# Patient Record
Sex: Female | Born: 1980
Health system: Southern US, Community
[De-identification: ages and names within clinical notes are randomized; demographics above are authoritative.]

## PROBLEM LIST (undated history)

## (undated) DIAGNOSIS — Z8659 Personal history of other mental and behavioral disorders: Secondary | ICD-10-CM

## (undated) DIAGNOSIS — O139 Gestational [pregnancy-induced] hypertension without significant proteinuria, unspecified trimester: Secondary | ICD-10-CM

## (undated) DIAGNOSIS — R12 Heartburn: Secondary | ICD-10-CM

## (undated) DIAGNOSIS — O2690 Pregnancy related conditions, unspecified, unspecified trimester: Secondary | ICD-10-CM

## (undated) DIAGNOSIS — Z8669 Personal history of other diseases of the nervous system and sense organs: Secondary | ICD-10-CM

## (undated) DIAGNOSIS — Z87442 Personal history of urinary calculi: Secondary | ICD-10-CM

## (undated) DIAGNOSIS — K589 Irritable bowel syndrome without diarrhea: Secondary | ICD-10-CM

## (undated) DIAGNOSIS — O26899 Other specified pregnancy related conditions, unspecified trimester: Secondary | ICD-10-CM

## (undated) HISTORY — DX: Gestational (pregnancy-induced) hypertension without significant proteinuria, unspecified trimester: O13.9

## (undated) HISTORY — DX: Pregnancy related conditions, unspecified, unspecified trimester: O26.90

## (undated) HISTORY — DX: Personal history of other diseases of the nervous system and sense organs: Z86.69

## (undated) HISTORY — DX: Personal history of other mental and behavioral disorders: Z86.59

## (undated) HISTORY — DX: Personal history of urinary calculi: Z87.442

## (undated) HISTORY — PX: WISDOM TOOTH EXTRACTION: SHX21

---

## 2000-11-27 ENCOUNTER — Other Ambulatory Visit: Admission: RE | Admit: 2000-11-27 | Discharge: 2000-11-27 | Payer: Self-pay | Admitting: Obstetrics and Gynecology

## 2004-02-08 ENCOUNTER — Emergency Department (HOSPITAL_COMMUNITY): Admission: EM | Admit: 2004-02-08 | Discharge: 2004-02-08 | Payer: Self-pay | Admitting: Emergency Medicine

## 2006-02-06 HISTORY — PX: ESOPHAGOGASTRODUODENOSCOPY: SHX1529

## 2006-02-06 HISTORY — PX: COLONOSCOPY: SHX174

## 2006-06-11 ENCOUNTER — Ambulatory Visit: Payer: Self-pay | Admitting: Gastroenterology

## 2006-06-15 ENCOUNTER — Encounter (INDEPENDENT_AMBULATORY_CARE_PROVIDER_SITE_OTHER): Payer: Self-pay | Admitting: Specialist

## 2006-06-15 ENCOUNTER — Ambulatory Visit (HOSPITAL_COMMUNITY): Admission: RE | Admit: 2006-06-15 | Discharge: 2006-06-15 | Payer: Self-pay | Admitting: Gastroenterology

## 2006-06-15 ENCOUNTER — Ambulatory Visit: Payer: Self-pay | Admitting: Gastroenterology

## 2006-07-11 ENCOUNTER — Ambulatory Visit: Payer: Self-pay | Admitting: Gastroenterology

## 2007-01-16 ENCOUNTER — Other Ambulatory Visit: Admission: RE | Admit: 2007-01-16 | Discharge: 2007-01-16 | Payer: Self-pay | Admitting: Obstetrics and Gynecology

## 2009-02-06 HISTORY — PX: DILATION AND CURETTAGE OF UTERUS: SHX78

## 2010-01-21 ENCOUNTER — Ambulatory Visit (HOSPITAL_COMMUNITY)
Admission: RE | Admit: 2010-01-21 | Discharge: 2010-01-21 | Payer: Self-pay | Source: Home / Self Care | Attending: Obstetrics and Gynecology | Admitting: Obstetrics and Gynecology

## 2010-02-06 DIAGNOSIS — Z87442 Personal history of urinary calculi: Secondary | ICD-10-CM

## 2010-02-06 HISTORY — DX: Personal history of urinary calculi: Z87.442

## 2010-04-18 LAB — CBC
MCH: 31.9 pg (ref 26.0–34.0)
MCH: 32.5 pg (ref 26.0–34.0)
MCHC: 34.8 g/dL (ref 30.0–36.0)
MCV: 93.8 fL (ref 78.0–100.0)
Platelets: 213 10*3/uL (ref 150–400)
Platelets: 229 10*3/uL (ref 150–400)
RDW: 12.9 % (ref 11.5–15.5)

## 2010-06-21 NOTE — Assessment & Plan Note (Signed)
NAMETHATIANA, RENBARGER              CHART#:  [id number]   DATE:  07/11/2006                       DOB:  [birth year]   REFERRING PHYSICIAN:  Dr. Carylon Perches.   PROBLEM LIST:  1. Rectal bleeding.  2. Loose stool for 11 years, likely secondary to irritable bowel.   SUBJECTIVE:  Ms. Ebony Cargo is a 30 year old female who is preparing to get  married. She presents as a return patient visit. She has not seen  anymore rectal bleeding. She adopted a high fiber diet and has noticed  improvement in her loose stool. She is not having loose stool with every  bowel movement. She has used fiber supplements in the past and has  stayed bloated. She received a sample of Digestive Advantage in the  mail and was wondering if she could use that. She is able to tolerate  broccoli and other fiber substances without bloating.   MEDICATIONS:  1. Estrostep daily.  2. Advil as needed.   OBJECTIVE:  VITAL SIGNS: Weight 181 pounds, height 5 foot 7 inches,  temperature 98.2, blood pressure 118/80, pulse 80.  GENERAL:  She is in no apparent distress, awake, alert, and oriented  x4.HEENT:  Atraumatic normocephalic, pupils equal, round, and reactive  to light, mouth shows no oral lesions, posterior pharynx without  erythema or exudate.  LUNGS:  Clear to auscultation bilaterally.CARDIOVASCULAR:  Regular  rhythm, no murmur, normal S1, S2.ABDOMEN:  Bowel sounds are present,  soft and nontender, nondistended, no rebound or guarding.   REVIEW OF SYSTEMS:  Last menstrual period: Currently she is  menstruating.   ASSESSMENT:  Ms. Ebony Cargo is a 30 year old female with chronic loose  stools, which is likely secondary to a functional gut disorder. Thank  you for allowing me to see Ms. Clayton in consultation. My  recommendations follow.   RECOMMENDATIONS:  1. Ms. Ebony Cargo should continue a high fiber diet. She was given a      handout on high fiber diet. We will not initiate a fiber supplement      because she has had  difficulty tolerating those in the past.  2. She may add Digestive Advantage which contains lactobacillus. She      is to try the lactobacillus for three weeks and if she notices a      significant improvement, she should continue those for an      additional six weeks.  3. If she shows no significant clinical improvement to her      satisfaction, then I have given her a prescription for Levsin      sublingual tablets 0.125 mg and she may use 1-2 sublingual every 6      hours as needed for her loose stool. I did discuss the side effects      of the medication which      include dry mouth, dry eyes, drowsiness, as well as urinary      retention.  4. She has a follow up appointment to see me in three months.       Kassie Mends, M.D.  Electronically Signed     SM/MEDQ  D:  07/11/2006  T:  07/12/2006  Job:  [id number]   cc:   Kingsley Callander. Ouida Sills, MD

## 2010-06-24 NOTE — Consult Note (Signed)
NAME:  Jacqueline, Vazquez             ACCOUNT NO.:  [id number]   MEDICAL RECORD NO.:  [id number]          PATIENT TYPE:  AMB   LOCATION:  DAY                           FACILITY:  [facility]   PHYSICIAN:  Kassie Mends, M.D.      DATE OF BIRTH:  [birth year]   DATE OF CONSULTATION:  06/15/2006  DATE OF DISCHARGE:                                 CONSULTATION   CHIEF COMPLAINT:  Bloody rectal bleeding   HISTORY OF PRESENT ILLNESS:  [name] is a 30 year old Caucasian  female who about nine days ago, she noted a significant amount of rectal  bleeding in the toilet. She also had a significant amount on the toilet  paper. She tells me she also had an episode of this about six months ago  for two days. This particular episode lasted five days. She tells me  that she has a self-reported history of IBS. Her stools are always  loose. On average, she has around four stools a day, but has at times  had up to ten to twelve loose watery stools per day. She says she has  occasional cramping, but not a lot in the way of abdominal pain. She  denies any proctalgia, pruritus, and denies any fever or chills. Denies  any recent foreign travel or antibiotics, new medications, or pets. She  is complaining of some bloating. She tells me as a child she was tested  for lactose intolerance and this was negative through [facility].   PAST MEDICAL HISTORY:  1. IBS  2. Renal lithiasis with tooth extraction   CURRENT MEDICATIONS:  1. Estrostep once daily  2. Advil 400 to 800 mg per day approximately once or twice per week   ALLERGIES:  PERCOCET AND FLAGYL.   FAMILY HISTORY:  There is no family history inflammatory bowel disease,  liver or chronic GI problems.   SOCIAL HISTORY:  Ms.  Jacqueline Vazquez is engaged to married June 21st. She is a  employed with [employer]. She has a less than 10 pack  year history of tobacco use. She drinks an alcoholic beverage every  three to four months. Denies any drug  use.   REVIEW OF SYSTEMS:  CONSTITUTIONAL: Her weight is stable. Denies any  fever or chills. Is complaining of some fatigue.  CARDIOVASCULAR:  Denies chest pain or palpitations.  PULMONARY:  Denies shortness of  breath, dyspnea, cough, or hemoptysis.  GI:  See HPI.  HEMATOLOGIC:  He  denies any history of anemia or blood dyscrasias. Denies any easy  bruising or epistaxis.  GYN:  Last menstrual period was approximately 28  days ago. She is on birth control pills. Denies any menorrhagia. She is  sexually active.   PHYSICAL EXAMINATION:  VITAL SIGNS:  Weight:  182 pounds. Height:  67  inches. Temp:  98.5. Blood pressure:  120/78. Pulse:  88.  GENERAL:  [name] is a well-developed, well-nourished Caucasian  female in no acute distress.  HEENT:  Sclerae clear, nonicteric. Conjunctivae pink.  Oropharynx pink  and moist without lesions.  NECK:  Supple with FROM,  no thyromegaly.  CHEST:  Heart regular, rate, and rhythm. Normal S1, S2 without murmurs,  rubs, or gallops.  LUNGS:  Clear to auscultation bilaterally.  ABDOMEN:  Positive bowel sounds times 4. No bruits auscultated, soft,  nontender, nondistended without palpable mass or hepatosplenomegaly.  No  rebound tenderness or guarding.  RECTAL:  Was deferred.  EXTREMITIES:  Without clubbing or edema bilaterally.  SKIN:  Pink, warm and dry without any rash or jaundice.   IMPRESSION:  [name] is a 30 year old Caucasian female with a  history of five days of rectal bleeding which started about nine days  ago. She also has chronic diarrhea and has been diagnosed with irritable  bowel syndrome.  Only has mild abdominal pain. We are going to need  further evaluation to determine the etiology of her rectal bleeding and  to rule out ulcerative colitis, Crohn's disease, diverticular bleeding,  benign intrarectal source such as internal hemorrhoids or fissure or  least likely colorectal carcinoma.   PLAN:  1. Obtain urine pregnancy,  CBC, and sed rate.  2. Colonoscopy with Dr. Cira Servant in the near future. I discussed with      procedure including the risks and benefits including but not      limited to an infection, perforation, drug reaction.   We would like to thank Dr. Ouida Sills for allowing [place] to participate in the  care of [name].      Nicholas Lose, N.P.      Kassie Mends, M.D.  Electronically Signed    KC/MEDQ  D:  06/12/2006  T:  06/12/2006  Job:  [id number]   cc:   Kingsley Callander. Ouida Sills, MD  Fax: [phone]

## 2010-06-24 NOTE — Op Note (Signed)
Jacqueline Vazquez, Jacqueline Vazquez             ACCOUNT NO.:  [id number]   MEDICAL RECORD NO.:  [id number]          PATIENT TYPE:  AMB   LOCATION:  DAY                           FACILITY:  [facility]   PHYSICIAN:  Kassie Mends, M.D.      DATE OF BIRTH:  07-23-1980   DATE OF PROCEDURE:  06/15/2006  DATE OF DISCHARGE:                               OPERATIVE REPORT   REFERRING PHYSICIAN:  Kingsley Callander. Ouida Sills, MD   PROCEDURES:  1. Colonoscopy with cold forceps biopsy.  2. Esophagogastroduodenoscopy with cold forceps biopsy.   INDICATION FOR EXAM:  [name] is a 30 year old female with rectal  bleeding.  She reports having a problem with diarrhea for the past 11  years.  She has been evaluated by [facility] in the past and told she  had irritable bowel.  She does not recall ever being evaluated for  celiac sprue.  The colonoscopy is being performed today to assess her  complaint of rectal bleeding.  The esophagogastroduodenoscopy is being  performed to biopsy her duodenum to evaluate for celiac sprue.   FINDINGS:  1. Normal colon without evidence of polyps, masses, inflammatory      changes, diverticula or AVMs.  No internal hemorrhoids.  Biopsies      obtained to evaluate for microscopic colitis.  2. Normal esophagus without evidence of Barrett's, erosions, or      ulcerations.  Normal stomach, duodenal bulb and second portion of      the duodenum.  Biopsies obtained to evaluate for celiac sprue.   RECOMMENDATIONS:  1. No source for rectal bleeding identified. Will call [name]      with the results of her biopsies.  2. She is given information on a high-fiber diet.  She is instructed      to begin the high-fiber diet after I call her with the results of      her biopsy report.  3. No aspirin, NSAIDs or anticoagulation for 5 days.  4. Follow-up appointment with Dr. Cira Servant in 3 weeks.  5. Colon cancer screeing at age 11.   MEDICATIONS:  1. Demerol 125 mg IV.  2. Versed 8 mg IV.   PROCEDURE  TECHNIQUE:  Physical exam was performed and informed consent  was obtained from the patient after explaining the benefits, risks and  alternatives to the procedure.  The patient was connected to the monitor  and placed in the left lateral position.  Continuous oxygen was provided  by nasal cannula and IV medicine administered through an indwelling  cannula.  After administration of sedation and rectal exam, the  patient's rectum was intubated and the scope was advanced under direct  visualization to the cecum.  The scope was subsequently removed slowly  by carefully examining the color, texture, anatomy and integrity of the  mucosa on the way out.   After the colonoscopy, the patient's esophagus was intubated and the  scope was advanced under direct visualization to the second portion of  the duodenum.  The scope was subsequently removed slowly by carefully  examine the color, texture, anatomy and integrity of the  mucosa on the  way out.  The patient was recovered in endoscopy and discharged home in  satisfactory condition.      Kassie Mends, M.D.  Electronically Signed     SM/MEDQ  D:  06/15/2006  T:  06/15/2006  Job:  [id number]   cc:   Kingsley Callander. Ouida Sills, MD  Fax: [phone]

## 2011-03-30 ENCOUNTER — Encounter (HOSPITAL_COMMUNITY): Payer: Self-pay | Admitting: *Deleted

## 2011-03-30 ENCOUNTER — Inpatient Hospital Stay (HOSPITAL_COMMUNITY)
Admission: AD | Admit: 2011-03-30 | Discharge: 2011-03-30 | Disposition: A | Discharge status: Home / Self Care | Payer: BC Managed Care – PPO | Source: Ambulatory Visit | Attending: Obstetrics and Gynecology | Admitting: Obstetrics and Gynecology

## 2011-03-30 DIAGNOSIS — O26859 Spotting complicating pregnancy, unspecified trimester: Secondary | ICD-10-CM

## 2011-03-30 LAB — CBC
Hemoglobin: 12.9 g/dL (ref 12.0–15.0)
MCH: 32.3 pg (ref 26.0–34.0)
MCHC: 34.4 g/dL (ref 30.0–36.0)
MCV: 94 fL (ref 78.0–100.0)

## 2011-03-30 LAB — WET PREP, GENITAL

## 2011-03-30 LAB — URINALYSIS, ROUTINE W REFLEX MICROSCOPIC
Bilirubin Urine: NEGATIVE
Glucose, UA: NEGATIVE mg/dL
Ketones, ur: 15 mg/dL — AB
Leukocytes, UA: NEGATIVE
Nitrite: NEGATIVE
Protein, ur: NEGATIVE mg/dL

## 2011-03-30 LAB — HCG, QUANTITATIVE, PREGNANCY: hCG, Beta Chain, Quant, S: 6575 m[IU]/mL — ABNORMAL HIGH (ref <5)

## 2011-03-30 NOTE — Progress Notes (Signed)
Pt states she had a pinkish red discharge -called the office RN and was told to come in and be evaulated

## 2011-03-30 NOTE — ED Provider Notes (Signed)
History     Chief Complaint  Patient presents with  . Vaginal Bleeding   HPI 31 y.o. G2P0010 at [redacted] weeks EGA with spotting since Sunday, today noticed "stringy" discharge along with spotting, no heavy bleeding or cramping. Is being followed in her doctor's office, has had normal progesterone level and normal rise in HCG. Has u/s scheduled on 3/13. Taking progesterone supplement d/t history of prior miscarriage.    Past Medical History  Diagnosis Date  . No pertinent past medical history     Past Surgical History  Procedure Date  . Dilation and evacuation   . Wisdom tooth extraction     Family History  Problem Relation Age of Onset  . Anesthesia problems Neg Hx     History  Substance Use Topics  . Smoking status: Former Games developer  . Smokeless tobacco: Not on file  . Alcohol Use: No    Allergies:  Allergies  Allergen Reactions  . Percocet (Oxycodone-Acetaminophen) Nausea And Vomiting  . Flagyl (Metronidazole Hcl) Rash    Prescriptions prior to admission  Medication Sig Dispense Refill  . ibuprofen (ADVIL,MOTRIN) 200 MG tablet Take 400 mg by mouth every 6 (six) hours as needed. Patient used this medication for pain.      . progesterone (ENDOMETRIN) 100 MG vaginal insert Place 100 mg vaginally 2 (two) times daily.      . Pseudoephedrine-Ibuprofen (ADVIL COLD/SINUS) 30-200 MG TABS Take 2 tablets by mouth daily as needed. Patient used this medication for cold symptoms.        Review of Systems  Constitutional: Negative.   Respiratory: Negative.   Cardiovascular: Negative.   Gastrointestinal: Negative for nausea, vomiting, abdominal pain, diarrhea and constipation.  Genitourinary: Negative for dysuria, urgency, frequency, hematuria and flank pain.       Positive for spotting  Musculoskeletal: Negative.   Neurological: Negative.   Psychiatric/Behavioral: Negative.    Physical Exam   Blood pressure 115/87, pulse 102, temperature 98.7 F (37.1 C), temperature source  Oral, resp. rate 20, height 5\' 7"  (1.702 m), weight 157 lb 6 oz (71.385 kg), SpO2 100.00%.  Physical Exam  Nursing note and vitals reviewed. Constitutional: She is oriented to person, place, and time. She appears well-developed and well-nourished. No distress.  HENT:  Head: Normocephalic and atraumatic.  Cardiovascular: Normal rate, regular rhythm and normal heart sounds.   Respiratory: Effort normal and breath sounds normal. No respiratory distress.  GI: Soft. Bowel sounds are normal. She exhibits no distension and no mass. There is no tenderness. There is no rebound and no guarding.  Genitourinary: There is no rash or lesion on the right labia. There is no rash or lesion on the left labia. Uterus is deviated. Cervix exhibits no discharge and no friability. There is bleeding (trace blood noted at cervix) around the vagina. No erythema or tenderness around the vagina. No vaginal discharge found.       Cervix closed   Neurological: She is alert and oriented to person, place, and time.  Skin: Skin is warm and dry.  Psychiatric: She has a normal mood and affect.    MAU Course  Procedures  Results for orders placed during the hospital encounter of 03/30/11 (from the past 24 hour(s))  URINALYSIS, ROUTINE W REFLEX MICROSCOPIC     Status: Abnormal   Collection Time   03/30/11  7:50 PM      Component Value Range   Color, Urine STRAW (*) YELLOW    APPearance CLEAR  CLEAR  Specific Gravity, Urine <1.005 (*) 1.005 - 1.030    pH 6.0  5.0 - 8.0    Glucose, UA NEGATIVE  NEGATIVE (mg/dL)   Hgb urine dipstick NEGATIVE  NEGATIVE    Bilirubin Urine NEGATIVE  NEGATIVE    Ketones, ur 15 (*) NEGATIVE (mg/dL)   Protein, ur NEGATIVE  NEGATIVE (mg/dL)   Urobilinogen, UA 0.2  0.0 - 1.0 (mg/dL)   Nitrite NEGATIVE  NEGATIVE    Leukocytes, UA NEGATIVE  NEGATIVE   POCT PREGNANCY, URINE     Status: Abnormal   Collection Time   03/30/11  8:40 PM      Component Value Range   Preg Test, Ur POSITIVE (*)  NEGATIVE   CBC     Status: Normal   Collection Time   03/30/11  8:40 PM      Component Value Range   WBC 10.1  4.0 - 10.5 (K/uL)   RBC 3.99  3.87 - 5.11 (MIL/uL)   Hemoglobin 12.9  12.0 - 15.0 (g/dL)   HCT 16.1  09.6 - 04.5 (%)   MCV 94.0  78.0 - 100.0 (fL)   MCH 32.3  26.0 - 34.0 (pg)   MCHC 34.4  30.0 - 36.0 (g/dL)   RDW 40.9  81.1 - 91.4 (%)   Platelets 187  150 - 400 (K/uL)  HCG, QUANTITATIVE, PREGNANCY     Status: Abnormal   Collection Time   03/30/11  8:40 PM      Component Value Range   hCG, Beta Chain, Quant, S 6575 (*) <5 (mIU/mL)  WET PREP, GENITAL     Status: Abnormal   Collection Time   03/30/11 10:05 PM      Component Value Range   Yeast Wet Prep HPF POC NONE SEEN  NONE SEEN    Trich, Wet Prep NONE SEEN  NONE SEEN    Clue Cells Wet Prep HPF POC NONE SEEN  NONE SEEN    WBC, Wet Prep HPF POC FEW (*) NONE SEEN    Pt prefers to wait for u/s as scheduled in office.   Assessment and Plan  31 y.o. G2P0010 at [redacted] weeks EGA Spotting - no evidence of SAB at this time, f/u as scheduled, precautions rev'd Jacqueline Vazquez 03/30/2011, 10:05 PM

## 2011-03-30 NOTE — Progress Notes (Signed)
Pt in c/o light spotting when wiping since Sunday.  Had labs drawn on Monday and on Wednesday BHCG had doubled.  States today had spotting as well but mixed with stringy mucus discharge.  Reports mild abdominal pain right behind belly button.  Reports lower back pain over weekend.  Is on progesterone suppository.  Has not had ultrasound yet.

## 2011-04-21 ENCOUNTER — Other Ambulatory Visit: Payer: Self-pay

## 2011-04-25 LAB — HIV ANTIBODY (ROUTINE TESTING W REFLEX)
HIV 1 Ab: NEGATIVE
HIV 2 Ab: NEGATIVE

## 2011-04-25 LAB — RUBELLA SCREEN: Rubella antibody, serum, titer: 44.1

## 2011-04-25 LAB — OB RESULTS CONSOLE RPR: RPR: NONREACTIVE

## 2011-04-25 LAB — HEPATITIS B SURFACE ANTIGEN: Hepatitis B Surface Antigen: NEGATIVE

## 2011-04-25 LAB — OB RESULTS CONSOLE HEPATITIS B SURFACE ANTIGEN: Hepatitis B Surface Ag: NEGATIVE

## 2011-04-25 LAB — RPR: RPR Ser-Titr: NONREACTIVE

## 2011-04-25 LAB — OB RESULTS CONSOLE HIV ANTIBODY (ROUTINE TESTING): HIV: NONREACTIVE

## 2011-04-25 LAB — OB RESULTS CONSOLE RUBELLA ANTIBODY, IGM: Rubella: IMMUNE

## 2011-05-30 ENCOUNTER — Other Ambulatory Visit (HOSPITAL_COMMUNITY): Payer: Self-pay | Admitting: Obstetrics and Gynecology

## 2011-05-30 DIAGNOSIS — IMO0002 Reserved for concepts with insufficient information to code with codable children: Secondary | ICD-10-CM

## 2011-05-30 DIAGNOSIS — Z0489 Encounter for examination and observation for other specified reasons: Secondary | ICD-10-CM

## 2011-06-27 ENCOUNTER — Ambulatory Visit (HOSPITAL_COMMUNITY)
Admission: RE | Admit: 2011-06-27 | Discharge: 2011-06-27 | Disposition: A | Discharge status: Home / Self Care | Payer: BC Managed Care – PPO | Source: Ambulatory Visit | Attending: Obstetrics and Gynecology | Admitting: Obstetrics and Gynecology

## 2011-06-27 ENCOUNTER — Encounter (HOSPITAL_COMMUNITY): Payer: Self-pay

## 2011-06-27 DIAGNOSIS — Z1389 Encounter for screening for other disorder: Secondary | ICD-10-CM | POA: Insufficient documentation

## 2011-06-27 DIAGNOSIS — IMO0002 Reserved for concepts with insufficient information to code with codable children: Secondary | ICD-10-CM

## 2011-06-27 DIAGNOSIS — Z363 Encounter for antenatal screening for malformations: Secondary | ICD-10-CM | POA: Insufficient documentation

## 2011-06-27 DIAGNOSIS — O358XX0 Maternal care for other (suspected) fetal abnormality and damage, not applicable or unspecified: Secondary | ICD-10-CM | POA: Insufficient documentation

## 2011-06-27 DIAGNOSIS — Z0489 Encounter for examination and observation for other specified reasons: Secondary | ICD-10-CM

## 2011-06-27 NOTE — ED Notes (Signed)
Patient not yet feeling fetal movement. Right hip pain at night with occasional cramping that has been going on since beginning of pregnancy. No recent spotting, bleeding approximately 3 weeks ago and found to have [facility] by [place]. Patient does have history of kidney stones.

## 2011-08-04 ENCOUNTER — Inpatient Hospital Stay (HOSPITAL_COMMUNITY): Payer: BC Managed Care – PPO

## 2011-08-04 ENCOUNTER — Encounter (HOSPITAL_COMMUNITY): Payer: Self-pay | Admitting: *Deleted

## 2011-08-04 ENCOUNTER — Inpatient Hospital Stay (HOSPITAL_COMMUNITY)
Admission: AD | Admit: 2011-08-04 | Discharge: 2011-08-04 | Disposition: A | Discharge status: Home / Self Care | Payer: BC Managed Care – PPO | Source: Ambulatory Visit | Attending: Obstetrics and Gynecology | Admitting: Obstetrics and Gynecology

## 2011-08-04 DIAGNOSIS — O99019 Anemia complicating pregnancy, unspecified trimester: Secondary | ICD-10-CM | POA: Insufficient documentation

## 2011-08-04 DIAGNOSIS — W19XXXA Unspecified fall, initial encounter: Secondary | ICD-10-CM

## 2011-08-04 DIAGNOSIS — O99891 Other specified diseases and conditions complicating pregnancy: Secondary | ICD-10-CM | POA: Insufficient documentation

## 2011-08-04 DIAGNOSIS — Z711 Person with feared health complaint in whom no diagnosis is made: Secondary | ICD-10-CM

## 2011-08-04 DIAGNOSIS — R296 Repeated falls: Secondary | ICD-10-CM | POA: Insufficient documentation

## 2011-08-04 DIAGNOSIS — D649 Anemia, unspecified: Secondary | ICD-10-CM | POA: Insufficient documentation

## 2011-08-04 HISTORY — DX: Irritable bowel syndrome, unspecified: K58.9

## 2011-08-04 LAB — CBC
HCT: 32.4 % — ABNORMAL LOW (ref 36.0–46.0)
Hemoglobin: 10.8 g/dL — ABNORMAL LOW (ref 12.0–15.0)
MCHC: 33.3 g/dL (ref 30.0–36.0)
RBC: 3.39 MIL/uL — ABNORMAL LOW (ref 3.87–5.11)

## 2011-08-04 LAB — KLEIHAUER-BETKE STAIN
Fetal Cells %: 0 %
Quantitation Fetal Hemoglobin: 0 mL

## 2011-08-04 NOTE — MAU Note (Addendum)
Pt stated she fell on her left side yesterday. Pt called MD yesterday but they did not call back until today and then told her to come in.Pt reports mild pain or discomfort  To right groin area that comes and goes at this time and reports fetal movment normal.

## 2011-08-04 NOTE — MAU Note (Signed)
Dr. Stinson at bedside.  

## 2011-08-04 NOTE — Discharge Instructions (Signed)
Injuries in Pregnancy Injuries can happen during pregnancy. Minor falls and accidents usually do not harm the mother or unborn baby. However, any injury should be reported to your doctor. HOME CARE  Do not take aspirin. It can make any bleeding you may have worse.   Put ice on the injured area.   Put ice in a plastic bag.   Place a towel between your skin and the bag.   Leave the ice on for 15 to 20 minutes, 3 to 4 times a day.   Put warm packs on the injured area after 24 hours if told by your doctor.   Have someone care for you and help you if needed.   Do not wear high heels while pregnant.   Remove rugs and loose objects on the floor.   Avoid fire or starting fires.   Avoid lifting heavy pots of boiling liquid.  GET HELP RIGHT AWAY IF:  You have been a victim of domestic violence.   You have been in a car accident.   You have more pain in any part of the body.   You have bleeding from your vagina.   Fluid is leaking from your vagina.   You start to have belly cramping (contractions) or pain.   You have a stiff neck or neck pain.   You feel weak or pass out (faint).   You start to throw up (vomit) after an injury.   You have been burned.   You get a headache or have vision problems after an injury.   You do not feel the baby move or the baby is not moving as much as normal.  MAKE SURE YOU:  Understand these instructions.   Will watch your condition.   Will get help right away if you are not doing well or get worse.  Document Released: 02/25/2010 Document Revised: 01/12/2011 Document Reviewed: 02/25/2010 [facility] Patient Information 2012 [place], [place].

## 2011-08-04 NOTE — MAU Provider Note (Signed)
History     CSN: [id number]  Arrival date and time: 08/04/11 1413   First Provider Initiated Contact with Patient 08/04/11 1535      Chief Complaint  Patient presents with  . Fall   HPI 31yo G2P0010 at 23.1 weeks who was sent here by on call physician due to fall that occurred yesterday at work.  Impact was on left hip - no impact to the abdomen.  She attempted to call her physician yesterday, but received no return call.  She reports no contractions or abdominal cramping, she denies vaginal bleeding.  OB History    Grav Para Term Preterm Abortions TAB SAB Ect Mult Living   2 0 0 0 1 0 1 0 0 0       Past Medical History  Diagnosis Date  . No pertinent past medical history   . Headache   . Kidney stones   . Irritable bowel syndrome   . Kidney stones     Past Surgical History  Procedure Date  . Dilation and evacuation   . Wisdom tooth extraction   . Dilation and curettage of uterus   . Colonoscpopy   . Upper gastrointestinal endoscopy     Family History  Problem Relation Age of Onset  . Anesthesia problems Neg Hx   . Other Mother     History  Substance Use Topics  . Smoking status: Former Games developer  . Smokeless tobacco: Not on file  . Alcohol Use: No    Allergies:  Allergies  Allergen Reactions  . Percocet (Oxycodone-Acetaminophen) Nausea And Vomiting  . Flagyl (Metronidazole Hcl) Rash    Prescriptions prior to admission  Medication Sig Dispense Refill  . acetaminophen (TYLENOL) 325 MG tablet Take 650 mg by mouth every 6 (six) hours as needed. For headache.      . Prenatal Vit-Fe Fumarate-FA (PRENATAL MULTIVITAMIN) TABS Take 1 tablet by mouth daily.        Review of Systems  All other systems reviewed and are negative.   Physical Exam   Blood pressure 128/74, pulse 100, temperature 98.6 F (37 C), temperature source Oral, resp. rate 18, height 5\' 7"  (1.702 m), weight 86.456 kg (190 lb 9.6 oz), last menstrual period 02/23/2011.  Physical Exam    Constitutional: She is oriented to person, place, and time. She appears well-developed and well-nourished.  HENT:  Head: Normocephalic and atraumatic.  GI: Soft. She exhibits no distension and no mass. There is no tenderness. There is no rebound and no guarding.  Neurological: She is alert and oriented to person, place, and time.  Skin: Skin is warm and dry.  Psychiatric: She has a normal mood and affect. Her behavior is normal. Judgment and thought content normal.   Results for orders placed during the hospital encounter of 08/04/11 (from the past 24 hour(s))  CBC     Status: Abnormal   Collection Time   08/04/11  4:25 PM      Component Value Range   WBC 10.1  4.0 - 10.5 K/uL   RBC 3.39 (*) 3.87 - 5.11 MIL/uL   Hemoglobin 10.8 (*) 12.0 - 15.0 g/dL   HCT 14.7 (*) 82.9 - 56.2 %   MCV 95.6  78.0 - 100.0 fL   MCH 31.9  26.0 - 34.0 pg   MCHC 33.3  30.0 - 36.0 g/dL   RDW 13.0  86.5 - 78.4 %   Platelets 182  150 - 400 K/uL  KLEIHAUER-BETKE STAIN     Status: Normal  Collection Time   08/04/11  4:25 PM      Component Value Range   Fetal Cells % 0.0     Quantitation Fetal Hemoglobin 0        MAU Course  Procedures Fetal monitoring - appropriate for gestational age.  No contractions, decelerations.  MDM KB neg.  CBC shows mild anemia.  [place] without evidence of placental abruption.  Assessment and Plan  1.  S/P fall  No evidence of fetal harm or injury.  Will send patient home.  Dr Arelia Sneddon informed of patient care.  Idamae Coccia JEHIEL 08/04/2011, 3:40 PM

## 2011-08-04 NOTE — MAU Note (Signed)
Dr Adrian Blackwater with pt discussing discharge plan of care. Pt agrees to plan.

## 2011-11-07 ENCOUNTER — Encounter (HOSPITAL_COMMUNITY): Payer: Self-pay | Admitting: Pharmacist

## 2011-11-13 ENCOUNTER — Encounter (HOSPITAL_COMMUNITY): Payer: Self-pay

## 2011-11-15 ENCOUNTER — Encounter (HOSPITAL_COMMUNITY): Payer: Self-pay

## 2011-11-15 ENCOUNTER — Encounter (HOSPITAL_COMMUNITY)
Admission: RE | Admit: 2011-11-15 | Discharge: 2011-11-15 | Disposition: A | Discharge status: Home / Self Care | Payer: BC Managed Care – PPO | Source: Ambulatory Visit | Attending: Obstetrics and Gynecology | Admitting: Obstetrics and Gynecology

## 2011-11-15 HISTORY — DX: Other specified pregnancy related conditions, unspecified trimester: O26.899

## 2011-11-15 HISTORY — DX: Other specified pregnancy related conditions, unspecified trimester: R12

## 2011-11-15 HISTORY — DX: Heartburn: R12

## 2011-11-15 LAB — CBC
HCT: 37 % (ref 36.0–46.0)
Hemoglobin: 12.6 g/dL (ref 12.0–15.0)
MCH: 32.8 pg (ref 26.0–34.0)
MCHC: 34.1 g/dL (ref 30.0–36.0)
MCV: 96.4 fL (ref 78.0–100.0)
Platelets: 158 K/uL (ref 150–400)
RBC: 3.84 MIL/uL — ABNORMAL LOW (ref 3.87–5.11)
RDW: 13.4 % (ref 11.5–15.5)
WBC: 10.8 K/uL — ABNORMAL HIGH (ref 4.0–10.5)

## 2011-11-15 LAB — TYPE AND SCREEN
ABO/RH(D): A POS
Antibody Screen: NEGATIVE

## 2011-11-15 LAB — SYPHILIS: RPR W/REFLEX TO RPR TITER AND TREPONEMAL ANTIBODIES, TRADITIONAL SCREENING AND DIAGNOSIS ALGORITHM: RPR Ser Ql: NONREACTIVE

## 2011-11-15 LAB — SURGICAL PCR SCREEN
MRSA, PCR: NEGATIVE
Staphylococcus aureus: NEGATIVE

## 2011-11-15 NOTE — Patient Instructions (Addendum)
   Your procedure is scheduled on: Thursday October 17th  Enter through the Main Entrance of [facility] at: 11:30am Pick up the phone at the desk and dial [phone] and inform us of your arrival.  Please call this number if you have any problems the morning of surgery: [phone]  Remember: Do not eat food after midnight on Wednesday You may have clear liquids until 9am on Thursday Take these medicines the morning of surgery with a SIP OF WATER:none  Do not wear jewelry, make-up, or FINGER nail polish No metal in your hair or on your body. Do not wear lotions, powders, perfumes. You may wear deodorant.  Please use your CHG wash as directed prior to surgery.  Do not shave anywhere for at least 12 hours prior to first CHG shower.  Do not bring valuables to the hospital. Contacts, dentures or bridgework may not be worn into surgery.  Leave suitcase in the car. After Surgery it may be brought to your room. For patients being admitted to the hospital, checkout time is 11:00am the day of discharge.

## 2011-11-23 ENCOUNTER — Encounter (HOSPITAL_COMMUNITY): Payer: Self-pay

## 2011-11-23 ENCOUNTER — Inpatient Hospital Stay (HOSPITAL_COMMUNITY): Payer: BC Managed Care – PPO

## 2011-11-23 ENCOUNTER — Inpatient Hospital Stay (HOSPITAL_COMMUNITY)
Admission: AD | Admit: 2011-11-23 | Discharge: 2011-11-26 | DRG: 371 | Disposition: A | Discharge status: Home / Self Care | Payer: BC Managed Care – PPO | Source: Ambulatory Visit | Attending: Obstetrics and Gynecology | Admitting: Obstetrics and Gynecology

## 2011-11-23 ENCOUNTER — Encounter (HOSPITAL_COMMUNITY): Payer: Self-pay | Admitting: *Deleted

## 2011-11-23 ENCOUNTER — Encounter (HOSPITAL_COMMUNITY)
Admission: AD | Disposition: A | Discharge status: Home / Self Care | Payer: Self-pay | Source: Ambulatory Visit | Attending: Obstetrics and Gynecology

## 2011-11-23 DIAGNOSIS — O43219 Placenta accreta, unspecified trimester: Secondary | ICD-10-CM

## 2011-11-23 DIAGNOSIS — O321XX0 Maternal care for breech presentation, not applicable or unspecified: Principal | ICD-10-CM | POA: Diagnosis present

## 2011-11-23 LAB — CBC
Hemoglobin: 12.5 g/dL (ref 12.0–15.0)
MCH: 31.7 pg (ref 26.0–34.0)
MCH: 32.3 pg (ref 26.0–34.0)
MCHC: 33.7 g/dL (ref 30.0–36.0)
MCHC: 34 g/dL (ref 30.0–36.0)
MCV: 95.9 fL (ref 78.0–100.0)
Platelets: 133 10*3/uL — ABNORMAL LOW (ref 150–400)
Platelets: 121 10*3/uL — ABNORMAL LOW (ref 150–400)
Platelets: 125 10*3/uL — ABNORMAL LOW (ref 150–400)
RBC: 3.94 MIL/uL (ref 3.87–5.11)
RDW: 13.1 % (ref 11.5–15.5)
RDW: 17.3 % — ABNORMAL HIGH (ref 11.5–15.5)
WBC: 9.1 10*3/uL (ref 4.0–10.5)

## 2011-11-23 LAB — DIC (DISSEMINATED INTRAVASCULAR COAGULATION)PANEL
D-Dimer, Quant: >20 ug/mL-FEU — ABNORMAL HIGH (ref 0.00–0.48)
INR: 1.22 (ref 0.00–1.49)
Platelets: 117 10*3/uL — ABNORMAL LOW (ref 150–400)
Smear Review: NONE SEEN
aPTT: 42 seconds — ABNORMAL HIGH (ref 24–37)

## 2011-11-23 LAB — MRSA PCR SCREENING: MRSA by PCR: NEGATIVE

## 2011-11-23 SURGERY — Surgical Case
Anesthesia: Spinal | Site: Abdomen | Wound class: Clean Contaminated

## 2011-11-23 MED ORDER — FENTANYL CITRATE 0.05 MG/ML IJ SOLN
INTRAMUSCULAR | Status: AC
  Filled 2011-11-23: qty 2

## 2011-11-23 MED ORDER — TETANUS-DIPHTH-ACELL PERTUSSIS 5-2.5-18.5 LF-MCG/0.5 IM SUSP
0.5000 mL | Freq: Once | INTRAMUSCULAR | Status: DC
  Filled 2011-11-23: qty 0.5

## 2011-11-23 MED ORDER — DIBUCAINE 1 % RE OINT: 1.0000 "application " | TOPICAL_OINTMENT | RECTAL | Status: DC | PRN

## 2011-11-23 MED ORDER — NALBUPHINE HCL 10 MG/ML IJ SOLN: 5.0000 mg | INTRAMUSCULAR | Status: DC | PRN

## 2011-11-23 MED ORDER — ONDANSETRON HCL 4 MG/2ML IJ SOLN: 4.0000 mg | Freq: Three times a day (TID) | INTRAMUSCULAR | Status: DC | PRN

## 2011-11-23 MED ORDER — ONDANSETRON HCL 4 MG/2ML IJ SOLN
INTRAMUSCULAR | Status: AC
  Filled 2011-11-23: qty 2

## 2011-11-23 MED ORDER — MORPHINE SULFATE 0.5 MG/ML IJ SOLN
INTRAMUSCULAR | Status: AC
  Filled 2011-11-23: qty 10

## 2011-11-23 MED ORDER — MENTHOL 3 MG MT LOZG: 1.0000 | LOZENGE | OROMUCOSAL | Status: DC | PRN

## 2011-11-23 MED ORDER — SIMETHICONE 80 MG PO CHEW
80.0000 mg | CHEWABLE_TABLET | Freq: Three times a day (TID) | ORAL | Status: DC
  Administered 2011-11-23 – 2011-11-26 (×11): 80 mg via ORAL

## 2011-11-23 MED ORDER — PHENYLEPHRINE HCL 10 MG/ML IJ SOLN
10000.0000 ug | INTRAVENOUS | Status: DC | PRN
  Administered 2011-11-23: 40 ug/min via INTRAVENOUS

## 2011-11-23 MED ORDER — OXYTOCIN 10 UNIT/ML IJ SOLN
INTRAMUSCULAR | Status: DC | PRN
  Administered 2011-11-23: 10 [IU] via INTRAMUSCULAR

## 2011-11-23 MED ORDER — EPHEDRINE 5 MG/ML INJ
INTRAVENOUS | Status: AC
  Filled 2011-11-23: qty 10

## 2011-11-23 MED ORDER — SCOPOLAMINE 1 MG/3DAYS TD PT72
MEDICATED_PATCH | TRANSDERMAL | Status: AC
  Administered 2011-11-23: 1.5 mg via TRANSDERMAL
  Filled 2011-11-23: qty 1

## 2011-11-23 MED ORDER — LANOLIN HYDROUS EX OINT: 1.0000 "application " | TOPICAL_OINTMENT | CUTANEOUS | Status: DC | PRN

## 2011-11-23 MED ORDER — HYDROCODONE-ACETAMINOPHEN 5-325 MG PO TABS
1.0000 | ORAL_TABLET | ORAL | Status: DC | PRN
  Administered 2011-11-23 – 2011-11-24 (×5): 1 via ORAL
  Administered 2011-11-25: 2 via ORAL
  Administered 2011-11-25 – 2011-11-26 (×3): 1 via ORAL
  Filled 2011-11-23: qty 2
  Filled 2011-11-23 (×8): qty 1

## 2011-11-23 MED ORDER — ONDANSETRON HCL 4 MG PO TABS: 4.0000 mg | ORAL_TABLET | ORAL | Status: DC | PRN

## 2011-11-23 MED ORDER — DIPHENHYDRAMINE HCL 25 MG PO CAPS: 25.0000 mg | ORAL_CAPSULE | Freq: Four times a day (QID) | ORAL | Status: DC | PRN

## 2011-11-23 MED ORDER — FAMOTIDINE IN NACL 20-0.9 MG/50ML-% IV SOLN
20.0000 mg | Freq: Two times a day (BID) | INTRAVENOUS | Status: DC
  Administered 2011-11-23 – 2011-11-24 (×3): 20 mg via INTRAVENOUS
  Filled 2011-11-23 (×4): qty 50

## 2011-11-23 MED ORDER — FENTANYL CITRATE 0.05 MG/ML IJ SOLN
25.0000 ug | INTRAMUSCULAR | Status: DC | PRN
  Administered 2011-11-23 (×3): 50 ug via INTRAVENOUS

## 2011-11-23 MED ORDER — OXYTOCIN 10 UNIT/ML IJ SOLN
INTRAMUSCULAR | Status: AC
  Filled 2011-11-23: qty 4

## 2011-11-23 MED ORDER — FENTANYL CITRATE 0.05 MG/ML IJ SOLN
INTRAMUSCULAR | Status: DC | PRN
  Administered 2011-11-23: 25 ug via INTRATHECAL

## 2011-11-23 MED ORDER — PHENYLEPHRINE 40 MCG/ML (10ML) SYRINGE FOR IV PUSH (FOR BLOOD PRESSURE SUPPORT)
PREFILLED_SYRINGE | INTRAVENOUS | Status: AC
  Filled 2011-11-23: qty 45

## 2011-11-23 MED ORDER — METHYLERGONOVINE MALEATE 0.2 MG/ML IJ SOLN
INTRAMUSCULAR | Status: DC | PRN
  Administered 2011-11-23: 0.2 mg via INTRAMUSCULAR

## 2011-11-23 MED ORDER — MEPERIDINE HCL 25 MG/ML IJ SOLN: 6.2500 mg | INTRAMUSCULAR | Status: DC | PRN

## 2011-11-23 MED ORDER — PHENYLEPHRINE HCL 10 MG/ML IJ SOLN
INTRAMUSCULAR | Status: DC | PRN
  Administered 2011-11-23 (×2): 80 ug via INTRAVENOUS
  Administered 2011-11-23: 40 ug via INTRAVENOUS
  Administered 2011-11-23 (×12): 80 ug via INTRAVENOUS

## 2011-11-23 MED ORDER — CARBOPROST TROMETHAMINE 250 MCG/ML IM SOLN
INTRAMUSCULAR | Status: DC | PRN
  Administered 2011-11-23: 250 ug via INTRAMUSCULAR

## 2011-11-23 MED ORDER — DIPHENHYDRAMINE HCL 50 MG/ML IJ SOLN: 12.5000 mg | INTRAMUSCULAR | Status: DC | PRN

## 2011-11-23 MED ORDER — LACTATED RINGERS IV SOLN
INTRAVENOUS | Status: DC
  Administered 2011-11-23 (×6): via INTRAVENOUS

## 2011-11-23 MED ORDER — MIDAZOLAM HCL 2 MG/2ML IJ SOLN
INTRAMUSCULAR | Status: AC
  Filled 2011-11-23: qty 2

## 2011-11-23 MED ORDER — CEFAZOLIN SODIUM-DEXTROSE 2-3 GM-% IV SOLR
2.0000 g | INTRAVENOUS | Status: AC
  Administered 2011-11-23: 2 g via INTRAVENOUS

## 2011-11-23 MED ORDER — METOCLOPRAMIDE HCL 5 MG/ML IJ SOLN: 10.0000 mg | Freq: Three times a day (TID) | INTRAMUSCULAR | Status: DC | PRN

## 2011-11-23 MED ORDER — MORPHINE SULFATE (PF) 0.5 MG/ML IJ SOLN
INTRAMUSCULAR | Status: DC | PRN
  Administered 2011-11-23: .15 mg via INTRATHECAL

## 2011-11-23 MED ORDER — WITCH HAZEL-GLYCERIN EX PADS: 1.0000 "application " | MEDICATED_PAD | CUTANEOUS | Status: DC | PRN

## 2011-11-23 MED ORDER — DIPHENHYDRAMINE HCL 50 MG/ML IJ SOLN: 25.0000 mg | INTRAMUSCULAR | Status: DC | PRN

## 2011-11-23 MED ORDER — MEASLES, MUMPS & RUBELLA VAC ~~LOC~~ INJ: 0.5000 mL | INJECTION | Freq: Once | SUBCUTANEOUS | Status: DC

## 2011-11-23 MED ORDER — KETOROLAC TROMETHAMINE 30 MG/ML IJ SOLN: 30.0000 mg | Freq: Four times a day (QID) | INTRAMUSCULAR | Status: DC | PRN

## 2011-11-23 MED ORDER — DIPHENHYDRAMINE HCL 25 MG PO CAPS: 25.0000 mg | ORAL_CAPSULE | ORAL | Status: DC | PRN

## 2011-11-23 MED ORDER — FENTANYL CITRATE 0.05 MG/ML IJ SOLN
INTRAMUSCULAR | Status: AC
  Administered 2011-11-23: 50 ug via INTRAVENOUS
  Filled 2011-11-23: qty 2

## 2011-11-23 MED ORDER — ONDANSETRON HCL 4 MG/2ML IJ SOLN
INTRAMUSCULAR | Status: DC | PRN
  Administered 2011-11-23: 4 mg via INTRAVENOUS

## 2011-11-23 MED ORDER — SIMETHICONE 80 MG PO CHEW: 80.0000 mg | CHEWABLE_TABLET | ORAL | Status: DC | PRN

## 2011-11-23 MED ORDER — PHENYLEPHRINE HCL 10 MG/ML IJ SOLN
INTRAMUSCULAR | Status: AC
  Filled 2011-11-23: qty 1

## 2011-11-23 MED ORDER — MEDROXYPROGESTERONE ACETATE 150 MG/ML IM SUSP: 150.0000 mg | INTRAMUSCULAR | Status: DC | PRN

## 2011-11-23 MED ORDER — IBUPROFEN 600 MG PO TABS: 600.0000 mg | ORAL_TABLET | Freq: Four times a day (QID) | ORAL | Status: DC | PRN

## 2011-11-23 MED ORDER — SODIUM CHLORIDE 0.9 % IJ SOLN
3.0000 mL | INTRAMUSCULAR | Status: DC | PRN
  Administered 2011-11-24 (×2): 3 mL via INTRAVENOUS

## 2011-11-23 MED ORDER — LIDOCAINE HCL (PF) 1 % IJ SOLN
INTRAMUSCULAR | Status: AC
  Filled 2011-11-23: qty 5

## 2011-11-23 MED ORDER — BUPIVACAINE IN DEXTROSE 0.75-8.25 % IT SOLN
INTRATHECAL | Status: DC | PRN
  Administered 2011-11-23: 1.7 mL via INTRATHECAL

## 2011-11-23 MED ORDER — CEFAZOLIN SODIUM-DEXTROSE 1-4 GM-% IV SOLR
1.0000 g | Freq: Three times a day (TID) | INTRAVENOUS | Status: DC
  Administered 2011-11-23 – 2011-11-25 (×5): 1 g via INTRAVENOUS
  Filled 2011-11-23 (×6): qty 50

## 2011-11-23 MED ORDER — SODIUM CHLORIDE 0.9 % IV BOLUS (SEPSIS)
500.0000 mL | Freq: Once | INTRAVENOUS | Status: AC
  Administered 2011-11-23: 500 mL via INTRAVENOUS

## 2011-11-23 MED ORDER — SENNOSIDES-DOCUSATE SODIUM 8.6-50 MG PO TABS
2.0000 | ORAL_TABLET | Freq: Every day | ORAL | Status: DC
  Administered 2011-11-23 – 2011-11-25 (×3): 2 via ORAL

## 2011-11-23 MED ORDER — CEFAZOLIN SODIUM-DEXTROSE 2-3 GM-% IV SOLR
INTRAVENOUS | Status: AC
  Filled 2011-11-23: qty 50

## 2011-11-23 MED ORDER — OXYTOCIN 10 UNIT/ML IJ SOLN
40.0000 [IU] | INTRAMUSCULAR | Status: DC | PRN
  Administered 2011-11-23: 40 [IU] via INTRAVENOUS

## 2011-11-23 MED ORDER — NALOXONE HCL 0.4 MG/ML IJ SOLN: 0.4000 mg | INTRAMUSCULAR | Status: DC | PRN

## 2011-11-23 MED ORDER — EPHEDRINE SULFATE 50 MG/ML IJ SOLN
INTRAMUSCULAR | Status: DC | PRN
  Administered 2011-11-23 (×2): 10 mg via INTRAVENOUS

## 2011-11-23 MED ORDER — ONDANSETRON HCL 4 MG/2ML IJ SOLN: 4.0000 mg | INTRAMUSCULAR | Status: DC | PRN

## 2011-11-23 MED ORDER — SODIUM CHLORIDE 0.9 % IV SOLN
INTRAVENOUS | Status: DC | PRN
  Administered 2011-11-23: 14:00:00 via INTRAVENOUS

## 2011-11-23 MED ORDER — IBUPROFEN 600 MG PO TABS
600.0000 mg | ORAL_TABLET | Freq: Four times a day (QID) | ORAL | Status: DC
  Administered 2011-11-23 – 2011-11-26 (×11): 600 mg via ORAL
  Filled 2011-11-23 (×11): qty 1

## 2011-11-23 MED ORDER — OXYTOCIN 40 UNITS IN LACTATED RINGERS INFUSION - SIMPLE MED
62.5000 mL/h | INTRAVENOUS | Status: AC
  Administered 2011-11-23: 62.5 mL/h via INTRAVENOUS
  Filled 2011-11-23: qty 1000

## 2011-11-23 MED ORDER — PRENATAL MULTIVITAMIN CH
1.0000 | ORAL_TABLET | Freq: Every day | ORAL | Status: DC
  Administered 2011-11-24 – 2011-11-26 (×2): 1 via ORAL
  Filled 2011-11-23 (×2): qty 1

## 2011-11-23 MED ORDER — SCOPOLAMINE 1 MG/3DAYS TD PT72
1.0000 | MEDICATED_PATCH | Freq: Once | TRANSDERMAL | Status: DC
  Administered 2011-11-23: 1.5 mg via TRANSDERMAL

## 2011-11-23 MED ORDER — SODIUM CHLORIDE 0.9 % IV SOLN: 1.0000 ug/kg/h | INTRAVENOUS | Status: DC | PRN

## 2011-11-23 MED ORDER — DEXTROSE IN LACTATED RINGERS 5 % IV SOLN
INTRAVENOUS | Status: DC
  Administered 2011-11-23: 500 mL via INTRAVENOUS
  Administered 2011-11-23 – 2011-11-24 (×2): via INTRAVENOUS

## 2011-11-23 SURGICAL SUPPLY — 41 items
ADH SKN CLS APL DERMABOND .7 (GAUZE/BANDAGES/DRESSINGS) ×1
BAG URINE DRAINAGE (UROLOGICAL SUPPLIES) ×1 IMPLANT
BALLN POSTPARTUM SOS BAKRI (BALLOONS) ×2
BALLOON POSTPARTUM SOS BAKRI (BALLOONS) IMPLANT
BANDAGE GAUZE ELAST BULKY 4 IN (GAUZE/BANDAGES/DRESSINGS) ×1 IMPLANT
CLOTH BEACON ORANGE TIMEOUT ST (SAFETY) ×2 IMPLANT
DERMABOND ADVANCED (GAUZE/BANDAGES/DRESSINGS) ×1
DERMABOND ADVANCED .7 DNX12 (GAUZE/BANDAGES/DRESSINGS) ×1 IMPLANT
DRAPE SURG 17X23 STRL (DRAPES) ×2 IMPLANT
DRSG COVADERM 4X10 (GAUZE/BANDAGES/DRESSINGS) ×2 IMPLANT
DURAPREP 26ML APPLICATOR (WOUND CARE) ×2 IMPLANT
ELECT REM PT RETURN 9FT ADLT (ELECTROSURGICAL) ×2
ELECTRODE REM PT RTRN 9FT ADLT (ELECTROSURGICAL) ×1 IMPLANT
EXTRACTOR VACUUM M CUP 4 TUBE (SUCTIONS) IMPLANT
GAUZE SPONGE 4X4 16PLY XRAY LF (GAUZE/BANDAGES/DRESSINGS) ×1 IMPLANT
GLOVE BIO SURGEON STRL SZ 6.5 (GLOVE) ×2 IMPLANT
GLOVE BIOGEL PI IND STRL 7.0 (GLOVE) ×2 IMPLANT
GLOVE BIOGEL PI INDICATOR 7.0 (GLOVE) ×2
GOWN PREVENTION PLUS LG XLONG (DISPOSABLE) ×6 IMPLANT
GOWN STRL REIN XL XLG (GOWN DISPOSABLE) ×2 IMPLANT
KIT ABG SYR 3ML LUER SLIP (SYRINGE) ×2 IMPLANT
NDL HYPO 25X5/8 SAFETYGLIDE (NEEDLE) ×1 IMPLANT
NEEDLE HYPO 22GX1.5 SAFETY (NEEDLE) ×2 IMPLANT
NEEDLE HYPO 25X5/8 SAFETYGLIDE (NEEDLE) ×2 IMPLANT
NS IRRIG 1000ML POUR BTL (IV SOLUTION) ×2 IMPLANT
PACK C SECTION WH (CUSTOM PROCEDURE TRAY) ×2 IMPLANT
PAD OB MATERNITY 4.3X12.25 (PERSONAL CARE ITEMS) IMPLANT
SLEEVE SCD COMPRESS KNEE MED (MISCELLANEOUS) ×1 IMPLANT
STAPLER VISISTAT 35W (STAPLE) ×1 IMPLANT
SUT CHROMIC 0 CT 802H (SUTURE) IMPLANT
SUT CHROMIC 0 CTX 36 (SUTURE) ×10 IMPLANT
SUT MON AB-0 CT1 36 (SUTURE) ×2 IMPLANT
SUT PDS AB 0 CTX 60 (SUTURE) ×2 IMPLANT
SUT PLAIN 0 NONE (SUTURE) IMPLANT
SUT VIC AB 4-0 KS 27 (SUTURE) ×1 IMPLANT
SYR 20CC LL (SYRINGE) ×1 IMPLANT
SYR 3ML LL SCALE MARK (SYRINGE) ×1 IMPLANT
TOWEL OR 17X24 6PK STRL BLUE (TOWEL DISPOSABLE) ×4 IMPLANT
TRAY FOLEY CATH 14FR (SET/KITS/TRAYS/PACK) ×1 IMPLANT
WATER STERILE IRR 1000ML POUR (IV SOLUTION) ×2 IMPLANT
YANKAUER SUCT BULB TIP NO VENT (SUCTIONS) ×1 IMPLANT

## 2011-11-23 NOTE — H&P (Signed)
31 yo G2P0 @ 39 weeks presents for primary c-section.  Baby Breech, declines version  Past History -  see hollister   Uterine synechia noted by US   GBS neg  AF, VSS Gen - NAD Abd - gravid, NT CV - RRR Lungs - clear bilat Ext - NT PV - cvx closed  A/P:  Breech - primary c-section R/b/a discussed

## 2011-11-23 NOTE — Anesthesia Postprocedure Evaluation (Signed)
  Anesthesia Post-op Note  Patient: Jacqueline Vazquez  Procedure(s) Performed: Procedure(s) (LRB) with comments: CESAREAN SECTION (N/A) - PRIMARY EDC 11/30/11  Patient Location: PACU  Anesthesia Type: Spinal  Level of Consciousness: awake, alert  and oriented  Airway and Oxygen Therapy: Patient Spontanous Breathing  Post-op Pain: none  Post-op Assessment: Post-op Vital signs reviewed, Patient's Cardiovascular Status Stable, Respiratory Function Stable, Patent Airway, No signs of Nausea or vomiting, Pain level controlled, No headache, No backache, No residual numbness and No residual motor weakness  Post-op Vital Signs: Reviewed and stable  Complications: No apparent anesthesia complications. Patient had unexpected placenta accreta as well as uterine hypotonia. She was transfused 2 units of PRBC's during procedure. Bakri balloon placed by Dr. Renaldo Fiddler. She will be discharged to the [facility]

## 2011-11-23 NOTE — Progress Notes (Signed)
Subjective: Postpartum Day 0: Cesarean Delivery Patient reports incisional pain.   Having cramps with breastfeeding.  Denies lightheadedness or dizziness.   No CP or SOB.    Objective: Vital signs in last 24 hours: Temp:  [97.5 F (36.4 C)-97.7 F (36.5 C)] 97.6 F (36.4 C) (10/17 1645) Pulse Rate:  [75-119] 75  (10/17 1700) Resp:  [18-24] 18  (10/17 1645) BP: (98-137)/(59-98) 113/73 mmHg (10/17 1700) SpO2:  [98 %-100 %] 98 % (10/17 1700) Weight:  [100.699 kg (222 lb)] 100.699 kg (222 lb) (10/17 1700)  Physical Exam:  General: alert and cooperative Lochia: appropriate Uterine Fundus: firm Incision: no significant drainage DVT Evaluation: No evidence of DVT seen on physical exam. Bakri output minimal   Basename 11/23/11 1405 11/23/11 1130  HGB 8.3* 12.5  HCT 24.6* 37.8    Assessment/Plan: Status post Cesarean section. Postoperative course complicated by placenta accreta & postpartum hemorrhage  S/p 2u PRBC intraoperatively Bakri balloon in place - watch output closely. Continue Ancef Rpt CBC ordered, pending  Anwita Mencer 11/23/2011, 6:19 PM

## 2011-11-23 NOTE — Anesthesia Procedure Notes (Signed)
Spinal  Patient location during procedure: OR Start time: 11/23/2011 12:58 PM Staffing Anesthesiologist: Labrenda Lasky A. Performed by: anesthesiologist  Preanesthetic Checklist Completed: patient identified, site marked, surgical consent, pre-op evaluation, timeout performed, IV checked, risks and benefits discussed and monitors and equipment checked Spinal Block Patient position: sitting Prep: site prepped and draped and DuraPrep Patient monitoring: heart rate, cardiac monitor, continuous pulse ox and blood pressure Approach: midline Location: L3-4 Injection technique: single-shot Needle Needle type: Sprotte  Needle gauge: 24 G Needle length: 9 cm Needle insertion depth: 6 cm Assessment Sensory level: T4 Additional Notes Patient tolerated procedure well. Adequate sensory level.

## 2011-11-23 NOTE — Progress Notes (Signed)
AC called me to attend a MTP in OR. Upon arrival, pt. Was stable and receiving blood. I assisted OR nurse with prepping the patient's legs for [facility] Balloon placement. I was present in the OR until the MTP was cancelled, and pt. Remained stable.

## 2011-11-23 NOTE — Anesthesia Preprocedure Evaluation (Signed)
Anesthesia Evaluation  Patient identified by MRN, date of birth, ID band Patient awake    Reviewed: Allergy & Precautions, H&P , Patient's Chart, lab work & pertinent test results  Airway Mallampati: III TM Distance: >3 FB Neck ROM: Full    Dental No notable dental hx. (+) Teeth Intact   Pulmonary former smoker,  breath sounds clear to auscultation  Pulmonary exam normal       Cardiovascular negative cardio ROS  Rhythm:Regular Rate:Normal     Neuro/Psych  Headaches, negative psych ROS   GI/Hepatic Neg liver ROS, GERD-  Medicated and Controlled,  Endo/Other  negative endocrine ROS  Renal/GU Hx/o Renal Calculi     Musculoskeletal   Abdominal Normal abdominal exam  (+)   Peds  Hematology negative hematology ROS (+)   Anesthesia Other Findings   Reproductive/Obstetrics (+) Pregnancy                           Anesthesia Physical Anesthesia Plan  ASA: II  Anesthesia Plan: Spinal   Post-op Pain Management:    Induction:   Airway Management Planned:   Additional Equipment:   Intra-op Plan:   Post-operative Plan:   Informed Consent: I have reviewed the patients History and Physical, chart, labs and discussed the procedure including the risks, benefits and alternatives for the proposed anesthesia with the patient or authorized representative who has indicated his/her understanding and acceptance.     Plan Discussed with: Anesthesiologist, Surgeon and CRNA  Anesthesia Plan Comments:         Anesthesia Quick Evaluation

## 2011-11-23 NOTE — Transfer of Care (Signed)
Immediate Anesthesia Transfer of Care Note  Patient: Jacqueline Vazquez  Procedure(s) Performed: Procedure(s) (LRB) with comments: CESAREAN SECTION (N/A) - PRIMARY EDC 11/30/11  Patient Location: PACU  Anesthesia Type: Spinal  Level of Consciousness: awake, alert  and oriented  Airway & Oxygen Therapy: Patient Spontanous Breathing  Post-op Assessment: Report given to PACU RN, Post -op Vital signs reviewed and stable and Patient moving all extremities X 4  Post vital signs: stable  Complications: No apparent anesthesia complications

## 2011-11-23 NOTE — Progress Notes (Signed)
Pt. Arrived from PACU; vital signs stable; pt. Alert awake and no discomfort. Will continue to monitor

## 2011-11-23 NOTE — OR Nursing (Addendum)
Dr Renaldo Fiddler choose to do her own prep for this patient  MTP CALLLED OFF SPOKE TO SAM IN LAB

## 2011-11-24 ENCOUNTER — Encounter (HOSPITAL_COMMUNITY): Payer: Self-pay | Admitting: Obstetrics and Gynecology

## 2011-11-24 LAB — CBC
HCT: 21.9 % — ABNORMAL LOW (ref 36.0–46.0)
MCH: 31.2 pg (ref 26.0–34.0)
MCH: 31.8 pg (ref 26.0–34.0)
MCHC: 35.1 g/dL (ref 30.0–36.0)
MCHC: 34.2 g/dL (ref 30.0–36.0)
MCV: 88.9 fL (ref 78.0–100.0)
Platelets: 90 10*3/uL — ABNORMAL LOW (ref 150–400)
Platelets: 103 10*3/uL — ABNORMAL LOW (ref 150–400)
Platelets: 125 10*3/uL — ABNORMAL LOW (ref 150–400)
RBC: 2.33 MIL/uL — ABNORMAL LOW (ref 3.87–5.11)
RDW: 17.8 % — ABNORMAL HIGH (ref 11.5–15.5)
RDW: 17.7 % — ABNORMAL HIGH (ref 11.5–15.5)
WBC: 14.8 10*3/uL — ABNORMAL HIGH (ref 4.0–10.5)

## 2011-11-24 LAB — RPR: RPR Ser Ql: NONREACTIVE

## 2011-11-24 NOTE — Progress Notes (Deleted)
Pain evaluation after 25ml removal fluid from Bakri balloon per Aurelio Brash, RN.  Abd remains tender, uncomfortable at incision site and aware of mild cramping but no further sharp pain as described earlier in RLQ.  Resting quietly, holding baby.  Husband at [facility].

## 2011-11-24 NOTE — Progress Notes (Signed)
Fluid bolus for dec. Urine output as per Dr. Renaldo Fiddler

## 2011-11-24 NOTE — Progress Notes (Signed)
Error - charting cleared from this time - should have been 2030.

## 2011-11-24 NOTE — Addendum Note (Signed)
Addendum  created 11/24/11 9604 by Shanon Payor, CRNA   Modules edited:Notes Section

## 2011-11-24 NOTE — Progress Notes (Signed)
Pain reevaluated after removal of 25ml fluid from Bakri balloon per Aurelio Brash, RN.  Abd remains tender and pt aware of discomfort at incision site, mild cramping but no further c/o sharp pain as described earlier.

## 2011-11-24 NOTE — Progress Notes (Signed)
25cc NS removed from Bakri balloon per Dr. Arelia Sneddon.  POC discussed with pt. And husband-both verbalized understainding.

## 2011-11-24 NOTE — Progress Notes (Signed)
Subjective: Postpartum Day one: Cesarean Delivery Patient reports tolerating PO.    Objective: Vital signs in last 24 hours: Temp:  [97.5 F (36.4 C)-98.1 F (36.7 C)] 97.5 F (36.4 C) (10/18 0800) Pulse Rate:  [75-121] 83  (10/18 0800) Resp:  [16-24] 18  (10/18 0800) BP: (96-137)/(55-98) 98/58 mmHg (10/18 0800) SpO2:  [98 %-100 %] 99 % (10/18 0800) Weight:  [100.699 kg (222 lb)] 100.699 kg (222 lb) (10/17 1700)  Physical Exam:  General: alert Lochia: Bakri balloon in place minimal drainage Uterine Fundus: firm Incision: dressing dry DVT Evaluation: No evidence of DVT seen on physical exam.   Basename 11/24/11 0505 11/23/11 1827  HGB 7.3* 10.2*  HCT 20.8* 30.0*    Assessment/Plan: Status post Cesarean section. Doing well postoperatively.  Recheck cbc. Saline lock.  Will deflate balloon starting in am .  Minh Roanhorse S 11/24/2011, 8:30 AM

## 2011-11-24 NOTE — Op Note (Signed)
Jacqueline Vazquez, Jacqueline Vazquez            ACCOUNT NO.:  [id number]  MEDICAL RECORD NO.:  [id number]  LOCATION:  9373                          FACILITY:  [facility]  PHYSICIAN:  Zelphia Cairo, MD    DATE OF BIRTH:  1980-12-13  DATE OF PROCEDURE: DATE OF DISCHARGE:                              OPERATIVE REPORT   PREOPERATIVE DIAGNOSES: 1. Intrauterine pregnancy at 39 weeks. 2. Breech presentation.  POSTOPERATIVE DIAGNOSES: 1. Intrauterine pregnancy at 39 weeks. 2. Breech presentation. 3. Suspect placenta accreta.  SURGEON:  Zelphia Cairo, MD  ASSISTANT:  Juluis Mire, MD  ESTIMATED BLOOD LOSS:  2500 mL.  URINE OUTPUT:  Clear.  BLOOD:  Two units transfusion.  SPECIMEN:  Placenta.  FINDINGS:  Vigorous female infant, normal-appearing adnexa and uterus, postpartum hemorrhage associated with atony of the left cornua and fundus of the uterus.  CONDITION:  Stable to recovery room.  PROCEDURE:  After informed consent was obtained, the patient was taken to the operating room.  She was given spinal anesthesia and prepped and draped in sterile fashion.  A Foley catheter was inserted sterilely. Pfannenstiel skin incision was made with a scalpel and extended sharply to the level of the fascia.  The fascia was incised in the midline and extended laterally using curved Mayo scissors.  Kocher clamps were used to grasp the superior and inferior portion of the fascia.  The fascia was tented upwards and underlying rectus muscles were dissected off using the Bovie and curved Mayo scissors.  Peritoneum was then identified and entered sharply.  This was extended superiorly and inferiorly with good visualization of the bladder.  The bladder blade was then inserted.  The vesicouterine peritoneum was dissected off of the lower uterine segment and the bladder blade was repositioned. Uterine incision was made with the scalpel and extended bluntly using my fingers.  The fetus was delivered using  standard breech maneuvers.  The cord was clamped and cut and the infant was taken to the awaiting pediatric staff.  The placenta was removed manually.  It was not easy to remove.  There was not a clear plane between the placenta and the uterine wall.  Following removal, the left cornua and fundus of the uterus remained very atonic.  Dry sponges were used to remove any remaining placental tissue.  The uterine incision was reapproximated. However, the left fundus remained atonic despite IV Methergine and additional Pitocin and aggressive uterine massage.  Because of this, the uterine incision was reopened and aggressive uterine massage, IM Methergine, and IM Pitocin were given.  A B-Lynch stitch was performed and the massive transfusion protocol was initiated.  After the B-Lynch stitch, the uterine incision was reapproximated using double layer closure.  The left fundus was slightly more firm, however, mild atony was still noted.  The decision was made to use a Bakri balloon.  The pelvis was irrigated with saline.  The peritoneum was reapproximated with 0 Monocryl.  The fascia was closed with looped 0 PDS and the skin was closed with staples.  Bandage was placed over the incision.  Uterine massage was continued as the Bakri balloon was inserted vaginally. Before insertion, the vagina was prepped with Betadine.  The Bakri balloon  was inserted into the uterus guided by my fingers and 150 mL was injected into the balloon.  Once hemostasis was achieved and no further blood return was noted to the Bakri catheter, the vagina was packed with moistened Kerlix.  I discussed the procedure in detail with the patient. Discussed postpartum hemorrhage and potential need for hysterectomy with she and her husband.  The patient was then taken to the recovery room in stable condition.  We will check a repeat hemoglobin in the recovery room and follow her closely in the AICU.  Sponge lap, needle,  and instrument counts were correct x2.     Zelphia Cairo, MD     GA/MEDQ  D:  11/23/2011  T:  11/24/2011  Job:  [id number]

## 2011-11-24 NOTE — Progress Notes (Signed)
Blood looked stagnant in tubing, flushed with 10cc of NS and blood immediately mobilized and drained to bag with NS

## 2011-11-24 NOTE — Anesthesia Postprocedure Evaluation (Signed)
  Anesthesia Post-op Note  Patient: Jacqueline Vazquez  Procedure(s) Performed: Procedure(s) (LRB) with comments: CESAREAN SECTION (N/A) - PRIMARY EDC 11/30/11  Patient Location: ICU  Anesthesia Type: Spinal  Level of Consciousness: awake, alert  and oriented  Airway and Oxygen Therapy: Patient Spontanous Breathing  Post-op Pain: none  Post-op Assessment: Post-op Vital signs reviewed and Patient's Cardiovascular Status Stable  Post-op Vital Signs: Reviewed and stable  Complications: No apparent anesthesia complications

## 2011-11-24 NOTE — Progress Notes (Signed)
UR chart review completed.  

## 2011-11-25 NOTE — Progress Notes (Signed)
Pt OOB to BR-tolerated fair.

## 2011-11-25 NOTE — Progress Notes (Signed)
Subjective: Postpartum Day two: Cesarean Delivery Patient reports incisional pain and tolerating PO.    Objective: Vital signs in last 24 hours: Temp:  [97.9 F (36.6 C)-99.1 F (37.3 C)] 98.1 F (36.7 C) (10/19 0800) Pulse Rate:  [80-115] 88  (10/19 0800) Resp:  [16-22] 16  (10/19 0800) BP: (103-135)/(56-78) 110/67 mmHg (10/19 0800) SpO2:  [92 %-100 %] 98 % (10/19 0800)  Physical Exam:  General: alert Lochia: appropriate Uterine Fundus: firm Incision: healing well DVT Evaluation: No evidence of DVT seen on physical exam. Bakri balloon removed along with packing no active bleeding noted  Basename 11/24/11 1937 11/24/11 1210  HGB 7.4* 7.5*  HCT 21.1* 21.9*    Assessment/Plan: Status post Cesarean section. Doing well postoperatively.  D/c foley saline lock. stopantibiotics.  Jacqueline Vazquez S 11/25/2011, 10:37 AM

## 2011-11-26 LAB — CBC
Hemoglobin: 7.1 g/dL — ABNORMAL LOW (ref 12.0–15.0)
MCH: 30.7 pg (ref 26.0–34.0)
MCHC: 33.3 g/dL (ref 30.0–36.0)
MCV: 92.2 fL (ref 78.0–100.0)
Platelets: 143 10*3/uL — ABNORMAL LOW (ref 150–400)
RBC: 2.31 MIL/uL — ABNORMAL LOW (ref 3.87–5.11)

## 2011-11-26 NOTE — Discharge Summary (Signed)
Physician Discharge Summary  Patient ID: Jacqueline Vazquez MRN: [id number] DOB/AGE: March 14, 1980 31 y.o.  Admit date: 11/23/2011 Discharge date: 11/26/2011  Admission Diagnoses: Intrauterine pregnancy at term with breech presentation.  Discharge Diagnoses: Same with placenta accreta. Active Problems:  * No active hospital problems. *    Discharged Condition: stable  Hospital Course: Patient came in for primary cesarean section due to breech presentation. During the C-section was noted that she had increasing bleeding. At the time the cesarean section the placenta was difficult to remove. After closing the uterus it was noted that she had continued bleeding issues. The uterus was reopened. It was noted in the left upper part of the uterus there was a thin area in the uterine wall. Patient was given IM Methergine and Hemabate. Uterus was injected with Pitocin. Despite this this area of the uterus continued to be soft and would not contract down well. It was felt that we are dealing with a acreata.  B. Lynch stitch was put in place. Uterine incision was then closed again. At the end of the procedure of bakri balloon was put in place. She was transferred to the don't intensive care unit. She did receive a total of 2 units of blood during the procedure. Stop she did extremely well. Her hemoglobin remained stable in the 7 range. On Saturday morning the balloon was deflated and removed. He had normal lochia at this point in time. The date of discharge her hemoglobin was 7.1 which is relatively stable. Her platelets have slowly risen. She is tolerating her diet. His had normal bowel and bladder function. He can be discharged home at this point in time.  Consults: None  Significant Diagnostic Studies: labs: hgb 7.1  Treatments: surgery: Low transverse cesarean section.  Discharge Exam: Blood pressure 124/83, pulse 79, temperature 98.1 F (36.7 C), temperature source Oral, resp. rate 18, height 5\' 7"   (1.702 m), weight 100.699 kg (222 lb), last menstrual period 02/23/2011, SpO2 100.00%, unknown if currently breastfeeding. General appearance: alert GI: soft, non-tender; bowel sounds normal; no masses,  no organomegaly Incision/Wound:intact  Disposition: 01-Home or Self Care     Medication List     As of 11/26/2011  9:18 AM    ASK your doctor about these medications         acetaminophen 325 MG tablet   Commonly known as: TYLENOL   Take 650 mg by mouth every 6 (six) hours as needed. For headache.      calcium carbonate 500 MG chewable tablet   Commonly known as: TUMS - dosed in mg elemental calcium   Chew 2 tablets by mouth 2 (two) times daily as needed. For heartburn      prenatal multivitamin Tabs   Take 1 tablet by mouth daily.      ranitidine 150 MG tablet   Commonly known as: ZANTAC   Take 150 mg by mouth 2 (two) times daily.         Signed: Lanissa Cashen S 11/26/2011, 9:18 AM

## 2011-11-26 NOTE — Progress Notes (Signed)
Subjective: Postpartum Day three: Cesarean Delivery Patient reports incisional pain, + flatus and no problems voiding.    Objective: Vital signs in last 24 hours: Temp:  [97.6 F (36.4 C)-98.3 F (36.8 C)] 98.1 F (36.7 C) (10/20 0357) Pulse Rate:  [79-108] 79  (10/20 0357) Resp:  [16-18] 18  (10/20 0357) BP: (117-138)/(77-85) 124/83 mmHg (10/20 0357) SpO2:  [100 %] 100 % (10/20 0357)  Physical Exam:  General: alert Lochia: appropriate Uterine Fundus: firm Incision: healing well DVT Evaluation: No evidence of DVT seen on physical exam.   Basename 11/26/11 0530 11/24/11 1937  HGB 7.1* 7.4*  HCT 21.3* 21.1*    Assessment/Plan: Status post Cesarean section. Doing well postoperatively.  Discharge home with standard precautions and return to clinic in 4-6 weeks.  Ethen Bannan S 11/26/2011, 9:16 AM

## 2011-11-27 LAB — TYPE AND SCREEN
ABO/RH(D): A POS
Unit division: 0
Unit division: 0
Unit division: 0

## 2011-11-27 LAB — PREPARE FRESH FROZEN PLASMA
Unit division: 0
Unit division: 0
Unit division: 0

## 2011-11-30 ENCOUNTER — Ambulatory Visit (HOSPITAL_COMMUNITY): Payer: BC Managed Care – PPO

## 2013-01-07 ENCOUNTER — Telehealth: Payer: Self-pay | Admitting: Family Medicine

## 2013-01-07 LAB — CBC
HGB: 13.6 g/dL
WBC: 3.5

## 2013-01-07 LAB — COMPLETE METABOLIC PANEL WITH GFR
ALT: 18 U/L (ref 7–35)
AST: 18 U/L
Alkaline Phosphatase: 40 U/L
Total Bilirubin: 0.5 mg/dL
Total Protein: 7.1 g/dL

## 2013-01-07 LAB — THYROID PANEL
Free T4: 1.53
T4 TOTAL: 13.4
TSH: 1.134

## 2013-01-07 NOTE — Telephone Encounter (Signed)
Pt wants to est w/you as her PCP and has an apptmt scheduled for 01/24/2013.  She is currently having some chest pains which she had checked out at [facility] in [place] on Sunday, 01/05/2013.  They did an EKG, which they said was normal (she has a copy).  They finally concluded that it's possibly acid reflux or anxiety. She is still having pain/pressure and is concerned. She wants to know if you could possibly see her sooner.  Can you accommodate her a new patient apptmt prior to 01/24/2013? Thank you.

## 2013-01-08 NOTE — Telephone Encounter (Signed)
Scheduled 01/16/2013 @ 4 p.m.

## 2013-01-08 NOTE — Telephone Encounter (Signed)
Earliest I can get her in is on the 11th at 4pm. plz try to obtain records from [facility] for me prior to her appointment

## 2013-01-16 ENCOUNTER — Encounter: Payer: Self-pay | Admitting: Family Medicine

## 2013-01-16 ENCOUNTER — Ambulatory Visit (INDEPENDENT_AMBULATORY_CARE_PROVIDER_SITE_OTHER): Payer: BC Managed Care – PPO | Admitting: Family Medicine

## 2013-01-16 VITALS — BP 126/78 | HR 72 | Temp 98.1°F | Ht 67.0 in | Wt 163.8 lb

## 2013-01-16 DIAGNOSIS — K589 Irritable bowel syndrome without diarrhea: Secondary | ICD-10-CM | POA: Insufficient documentation

## 2013-01-16 DIAGNOSIS — D72819 Decreased white blood cell count, unspecified: Secondary | ICD-10-CM

## 2013-01-16 DIAGNOSIS — R0789 Other chest pain: Secondary | ICD-10-CM

## 2013-01-16 DIAGNOSIS — F411 Generalized anxiety disorder: Secondary | ICD-10-CM

## 2013-01-16 DIAGNOSIS — F419 Anxiety disorder, unspecified: Secondary | ICD-10-CM

## 2013-01-16 MED ORDER — NAPROXEN 500 MG PO TABS: ORAL_TABLET | ORAL | Status: DC

## 2013-01-16 NOTE — Progress Notes (Signed)
Subjective:    Patient ID: Jacqueline Vazquez, female    DOB: March 08, 1980, 32 y.o.   MRN: [id number]  HPI CC: new pt to establish  Overall healthy.  Was unable to return to see prior PCP.  She was recently seen at [facility] (01/05/2013) with chest pain present for a month - work up including EKG was normal.  Told this may be due to anxiety or GERD.  Describes substernal chest pain described as dull ache "as if popping would feel better", not reproducible with palpation, worse with deep breath.  Worse if slunches, better if sits upright.  Does have h/o bad posture, works on [employer] all day, cares for 32 year old.  Doesn't feel like her prior heartburn during pregnancy.  Does burp significantly.  Grandfather with h/o esophageal cancer at age 11yo.  Some dyspnea but no tachycardia or cough.  No h/o allergic rhinitis. [facility] records reviewed  I have records from recent OBGYN blood work showing low WBC at 3.5, normal platelets and RBC, and normal CMP. On day of blood draw (with mild leukopenia) did start vomiting then diarrhea for 2-3 days now feeling better.  L ear increased congestion for last several weeks - blowing nose to open up ear.  Also noticing neck pain bilaterally.    Does endorse some history of anxiety and feels she excessively worries.  Smoking - quit smoking 1 mo ago.  Was prior a 5 cig/day smoker. OCP - longstanding use.  Working on lowering dose of birth control. No prolonged periods of immobility. No fmhx DVT or PE.  Mother with h/o phlebitis. Daughter born 11/2011.  Preventative: Well woman with OBGYN 01/2013  Medications and allergies reviewed and updated in chart.  Past histories reviewed and updated if relevant as below. There are no active problems to display for this patient.  Past Medical History  Diagnosis Date  . Irritable bowel syndrome   . Heartburn in pregnancy   . History of kidney stones 2012  . Hx of migraines   . History of depression college    resolved  with 2 yrs zoloft  . Gestational hypertension   . Pregnancy complication     placenta accreta with hemorrhage with ICU stay, gest HTN   Past Surgical History  Procedure Laterality Date  . Wisdom tooth extraction    . Dilation and curettage of uterus  2011    miscarriage  . Colonoscopy  2008    IBS, int hemmorhoids, blood with BMs ([place])  . Esophagogastroduodenoscopy  2008  . Cesarean section  11/23/2011    Surgeon: Zelphia Cairo, MD; PRIMARY   History  Substance Use Topics  . Smoking status: Former Smoker    Quit date: 12/15/2012  . Smokeless tobacco: Never Used  . Alcohol Use: No   Family History  Problem Relation Age of Onset  . Hypertension Father   . Diabetes Father   . Diabetes Maternal Grandmother   . Hyperlipidemia Father   . Depression Mother     with anxiety  . Cancer Maternal Grandfather     esophageal and lung (smoker)  . Heart disease Father     unsure details  . Cancer Sister     ovarian/cervical  . Stroke Neg Hx    Allergies  Allergen Reactions  . Percocet [Oxycodone-Acetaminophen] Nausea And Vomiting  . Flagyl [Metronidazole Hcl] Rash   No current outpatient prescriptions on file prior to visit.   No current facility-administered medications on file prior to visit.  Review of Systems     Objective:   Physical Exam  Nursing note and vitals reviewed. Constitutional: She is oriented to person, place, and time. She appears well-developed and well-nourished. No distress.  HENT:  Head: Normocephalic and atraumatic.  Right Ear: Hearing, tympanic membrane, external ear and ear canal normal.  Left Ear: Hearing, tympanic membrane, external ear and ear canal normal.  Nose: Nose normal.  Mouth/Throat: Uvula is midline and oropharynx is clear and moist. No oropharyngeal exudate, posterior oropharyngeal edema, posterior oropharyngeal erythema or tonsillar abscesses.  Eyes: Conjunctivae and EOM are normal. Pupils are equal, round, and reactive  to light.  Neck: Normal range of motion. Neck supple. No thyromegaly present.  Cardiovascular: Normal rate, regular rhythm, normal heart sounds and intact distal pulses.   No murmur heard. Pulses:      Radial pulses are 2+ on the right side, and 2+ on the left side.  Pulmonary/Chest: Effort normal and breath sounds normal. No respiratory distress. She has no wheezes. She has no rales. She exhibits tenderness.    Reproducible tenderness and swelling noted to palpation of right 4th-5th costochondral junction  Abdominal: Soft. Normal appearance and bowel sounds are normal. She exhibits no distension and no mass. There is tenderness (minimal) in the epigastric area. There is no rigidity, no rebound, no guarding, no CVA tenderness and negative Murphy's sign.  Musculoskeletal: Normal range of motion.  Lymphadenopathy:    She has no cervical adenopathy.  Neurological: She is alert and oriented to person, place, and time.  CN grossly intact, station and gait intact  Skin: Skin is warm and dry. No rash noted.  Psychiatric: She has a normal mood and affect. Her behavior is normal. Judgment and thought content normal.       Assessment & Plan:

## 2013-01-16 NOTE — Patient Instructions (Signed)
I'm suspicious for costochondritis given description of pain and exam today. I would like to treat you for possible heartburn first with 1 week course of nexium 20mg  daily (samples provided today). If this doesn't help, start naprosyn 500mg  twice daily with food for 1 week daily then as needed for pain. May start ice/heating pad right away for pain. If not better with this, return to see me.  Good to meet you today, call us with questions.  Costochondritis Costochondritis (Tietze syndrome), or costochondral separation, is a swelling and irritation (inflammation) of the tissue (cartilage) that connects your ribs with your breastbone (sternum). It may occur on its own (spontaneously), through damage caused by an accident (trauma), or simply from coughing or minor exercise. It may take up to 6 weeks to get better and longer if you are unable to be conservative in your activities. HOME CARE INSTRUCTIONS   Avoid exhausting physical activity. Try not to strain your ribs during normal activity. This would include any activities using chest, belly (abdominal), and side muscles, especially if heavy weights are used.  Use ice for 15-20 minutes per hour while awake for the first 2 days. Place the ice in a plastic bag, and place a towel between the bag of ice and your skin.  Only take over-the-counter or prescription medicines for pain, discomfort, or fever as directed by your caregiver. SEEK IMMEDIATE MEDICAL CARE IF:   Your pain increases or you are very uncomfortable.  You have a fever.  You develop difficulty with your breathing.  You cough up blood.  You develop worse chest pains, shortness of breath, sweating, or vomiting.  You develop new, unexplained problems (symptoms). MAKE SURE YOU:   Understand these instructions.  Will watch your condition.  Will get help right away if you are not doing well or get worse. Document Released: 11/02/2004 Document Revised: 04/17/2011 Document  Reviewed: 08/27/2012 [facility] Patient Information 2014 [place], [place].

## 2013-01-16 NOTE — Progress Notes (Signed)
Pre-visit discussion using our clinic review tool. No additional management support is needed unless otherwise documented below in the visit note.  

## 2013-01-17 ENCOUNTER — Ambulatory Visit: Payer: BC Managed Care – PPO | Admitting: Family Medicine

## 2013-01-17 DIAGNOSIS — D72819 Decreased white blood cell count, unspecified: Secondary | ICD-10-CM | POA: Insufficient documentation

## 2013-01-17 DIAGNOSIS — F411 Generalized anxiety disorder: Secondary | ICD-10-CM | POA: Insufficient documentation

## 2013-01-17 DIAGNOSIS — R0789 Other chest pain: Secondary | ICD-10-CM | POA: Insufficient documentation

## 2013-01-17 NOTE — Assessment & Plan Note (Signed)
Anticipate she does have anxiety but doubt this is contributing to her current chest pain.

## 2013-01-17 NOTE — Assessment & Plan Note (Signed)
Mild, ANC normal Anticipate viral gastroenteritis related.

## 2013-01-17 NOTE — Assessment & Plan Note (Addendum)
Story/exam more consistent with costochondritis, so will treat with naprosyn 500mg  bid scheduled for 7 days then PRN.  Handout provided. Less likely GERD/dyspepsia - provided with samples for 1 wk course of nexium 20mg  daily.  If no better, fill NSAID sent to pharmacy.

## 2013-01-17 NOTE — Assessment & Plan Note (Signed)
This is currently stable

## 2013-01-20 ENCOUNTER — Other Ambulatory Visit: Payer: Self-pay | Admitting: Family Medicine

## 2013-01-20 DIAGNOSIS — D72819 Decreased white blood cell count, unspecified: Secondary | ICD-10-CM

## 2013-01-21 ENCOUNTER — Other Ambulatory Visit (INDEPENDENT_AMBULATORY_CARE_PROVIDER_SITE_OTHER): Payer: BC Managed Care – PPO

## 2013-01-21 DIAGNOSIS — D72819 Decreased white blood cell count, unspecified: Secondary | ICD-10-CM

## 2013-01-21 LAB — CBC WITH DIFFERENTIAL/PLATELET
Basophils Absolute: 0 10*3/uL (ref 0.0–0.1)
Eosinophils Absolute: 0.1 10*3/uL (ref 0.0–0.7)
HCT: 39.7 % (ref 36.0–46.0)
Lymphs Abs: 1.6 10*3/uL (ref 0.7–4.0)
MCV: 91.8 fl (ref 78.0–100.0)
Monocytes Absolute: 0.5 10*3/uL (ref 0.1–1.0)
Neutrophils Relative %: 58.2 % (ref 43.0–77.0)
Platelets: 237 10*3/uL (ref 150.0–400.0)
RDW: 13.6 % (ref 11.5–14.6)

## 2013-01-22 ENCOUNTER — Telehealth: Payer: Self-pay

## 2013-01-22 NOTE — Telephone Encounter (Signed)
Pt request cb at either # listed under contacts when gets 01/21/13 lab results.

## 2013-01-24 ENCOUNTER — Encounter: Payer: Self-pay | Admitting: *Deleted

## 2013-01-24 ENCOUNTER — Ambulatory Visit: Payer: BC Managed Care – PPO | Admitting: Family Medicine

## 2013-01-24 NOTE — Telephone Encounter (Signed)
Spoke with patient.

## 2013-01-27 ENCOUNTER — Encounter: Payer: Self-pay | Admitting: *Deleted

## 2013-02-04 ENCOUNTER — Encounter: Payer: Self-pay | Admitting: Family Medicine

## 2013-02-04 ENCOUNTER — Ambulatory Visit (INDEPENDENT_AMBULATORY_CARE_PROVIDER_SITE_OTHER): Payer: BC Managed Care – PPO | Admitting: Family Medicine

## 2013-02-04 VITALS — BP 96/72 | HR 96 | Temp 97.4°F | Wt 157.5 lb

## 2013-02-04 DIAGNOSIS — J020 Streptococcal pharyngitis: Secondary | ICD-10-CM

## 2013-02-04 DIAGNOSIS — B349 Viral infection, unspecified: Secondary | ICD-10-CM

## 2013-02-04 DIAGNOSIS — R059 Cough, unspecified: Secondary | ICD-10-CM

## 2013-02-04 DIAGNOSIS — R05 Cough: Secondary | ICD-10-CM

## 2013-02-04 DIAGNOSIS — B9789 Other viral agents as the cause of diseases classified elsewhere: Secondary | ICD-10-CM

## 2013-02-04 LAB — POCT RAPID STREP A (OFFICE): Rapid Strep A Screen: NEGATIVE

## 2013-02-04 LAB — POCT INFLUENZA A/B: Influenza B, POC: NEGATIVE

## 2013-02-04 MED ORDER — LIDOCAINE VISCOUS 2 % MT SOLN: 10.0000 mL | OROMUCOSAL | Status: DC | PRN

## 2013-02-04 NOTE — Progress Notes (Signed)
Pre-visit discussion using our clinic review tool. No additional management support is needed unless otherwise documented below in the visit note.  Daughter recently sick.  Pt started with sx in the last 2 days.  Now with ST, fevers, chills, congested, cough.  Neck is sore.  Voice is altered.  No vomiting, no diarrhea. No rash.  L ear is popping.  No R ear sx.  Discolored rhinorrhea.    ST is most bothersome for her.    Meds, vitals, and allergies reviewed.   ROS: See HPI.  Otherwise, noncontributory.  GEN: nad, alert and oriented HEENT: mucous membranes moist, tm w/o erythema, nasal exam w/o erythema, clear discharge noted,  OP with cobblestoning NECK: supple w/o LA CV: rrr.   PULM: ctab, no inc wob EXT: no edema SKIN: no acute rash

## 2013-02-04 NOTE — Patient Instructions (Signed)
Drink plenty of fluids, take tylenol as needed, and gargle with warm salt water for your throat.  If not better, then use lidocaine for your throat. This should gradually improve.  Take care.  Let us know if you have other concerns.

## 2013-02-04 NOTE — Assessment & Plan Note (Addendum)
rst and flu neg; likely viral.  Nontoxic.  Prevalent in community recently.  Fu prn.  Supportive care.  Lidocaine prn for ST if salt water gargles don't help.  She agrees.

## 2013-02-10 ENCOUNTER — Encounter: Payer: Self-pay | Admitting: Family Medicine

## 2013-02-19 ENCOUNTER — Encounter: Payer: Self-pay | Admitting: *Deleted

## 2013-05-12 ENCOUNTER — Encounter: Payer: Self-pay | Admitting: Family Medicine

## 2013-05-12 ENCOUNTER — Ambulatory Visit (INDEPENDENT_AMBULATORY_CARE_PROVIDER_SITE_OTHER): Payer: BC Managed Care – PPO | Admitting: Family Medicine

## 2013-05-12 VITALS — BP 110/64 | HR 84 | Temp 97.4°F | Wt 162.0 lb

## 2013-05-12 DIAGNOSIS — R221 Localized swelling, mass and lump, neck: Secondary | ICD-10-CM | POA: Insufficient documentation

## 2013-05-12 DIAGNOSIS — R220 Localized swelling, mass and lump, head: Secondary | ICD-10-CM

## 2013-05-12 DIAGNOSIS — R0789 Other chest pain: Secondary | ICD-10-CM

## 2013-05-12 LAB — CBC WITH DIFFERENTIAL/PLATELET
BASOS PCT: 0.6 % (ref 0.0–3.0)
Basophils Absolute: 0 10*3/uL (ref 0.0–0.1)
EOS PCT: 0.4 % (ref 0.0–5.0)
Eosinophils Absolute: 0 10*3/uL (ref 0.0–0.7)
HCT: 39.5 % (ref 36.0–46.0)
HEMOGLOBIN: 13.2 g/dL (ref 12.0–15.0)
LYMPHS ABS: 1.8 10*3/uL (ref 0.7–4.0)
Lymphocytes Relative: 38.8 % (ref 12.0–46.0)
MCHC: 33.5 g/dL (ref 30.0–36.0)
MCV: 92.8 fl (ref 78.0–100.0)
MONO ABS: 0.3 10*3/uL (ref 0.1–1.0)
Monocytes Relative: 7.6 % (ref 3.0–12.0)
NEUTROS ABS: 2.4 10*3/uL (ref 1.4–7.7)
Neutrophils Relative %: 52.6 % (ref 43.0–77.0)
Platelets: 187 10*3/uL (ref 150.0–400.0)
RBC: 4.25 Mil/uL (ref 3.87–5.11)
RDW: 12.9 % (ref 11.5–14.6)
WBC: 4.5 10*3/uL (ref 4.5–10.5)

## 2013-05-12 LAB — HIGH SENSITIVITY CRP: CRP, High Sensitivity: 2.87 mg/L (ref 0.000–5.000)

## 2013-05-12 MED ORDER — OMEPRAZOLE 40 MG PO CPDR: 40.0000 mg | DELAYED_RELEASE_CAPSULE | Freq: Every day | ORAL | Status: DC

## 2013-05-12 MED ORDER — METHOCARBAMOL 500 MG PO TABS: 500.0000 mg | ORAL_TABLET | Freq: Three times a day (TID) | ORAL | Status: DC | PRN

## 2013-05-12 NOTE — Assessment & Plan Note (Signed)
This is most consistent with GERD as it does significantly improved with PPI.  However having some side effects to PPI so change to omeprazole.

## 2013-05-12 NOTE — Assessment & Plan Note (Signed)
Anticipate reactive LAD from current viral infection vs ?myositis - check throat [place] to further evaluate this. Given endorsed skin hardening and tightness, check Scl-70 and ANA today. Will call pt with results. May use muscle relaxant for neck tightness endorsed. ?all GERD related.

## 2013-05-12 NOTE — Progress Notes (Signed)
Pre visit review using our clinic review tool, if applicable. No additional management support is needed unless otherwise documented below in the visit note. 

## 2013-05-12 NOTE — Patient Instructions (Signed)
Change nexium to omeprazole. Pass by [facility] office to schedule neck ultrasound. Blood work today. May try muscle relaxant for tight neck muscles (sent in today). Good to see you today, we will call you with results.

## 2013-05-12 NOTE — Progress Notes (Signed)
BP 110/64  Pulse 84  Temp(Src) 97.4 F (36.3 C) (Oral)  Wt 162 lb (73.483 kg)  SpO2 98%  LMP 04/18/2013   CC: neck pain  Subjective:    Patient ID: Jacqueline Vazquez, female    DOB: 1981-01-16, 33 y.o.   MRN: [id number]  HPI: Jacqueline Vazquez is a 33 y.o. female presenting on 05/12/2013 for Neck Pain   Chest discomfort (see prior note for details) - improves while on nexium.  But nexium causes bloating/constipation and does continue feeling fullness in throat.  Continued globus hystericus sensation - "like something stuck in back of throat".  Denies dysphagia.  No nausea/vomiting.  No early satiety.  Endorses skin tightness at neck as well as stiffness .  Now over last 1.5 weeks noticed swelling at left side of neck.  Endorses mild hoarseness over last 2 wks as well.  Denies recent viral illness, significant congestion, fevers.  Mild allergy sxs on and off.  Slightly more allergy sxs recently.  Denies cough, wheezing, dyspnea.  + chills last night.  Did have pharyngitis 01/2013 but RST and flu swabs negative.  H/o EGD/colonoscopy 2008 for blood in stool attributed to int hemm.  States EGD was normal. Grandfather with esoph cancer (smoker). Pt quit smoking 12/2012. Wt Readings from Last 3 Encounters:  05/12/13 162 lb (73.483 kg)  02/04/13 157 lb 8 oz (71.442 kg)  01/16/13 163 lb 12 oz (74.277 kg)  Body mass index is 25.37 kg/(m^2).  Relevant past medical, surgical, family and social history reviewed and updated as indicated.  Allergies and medications reviewed and updated. Current Outpatient Prescriptions on File Prior to Visit  Medication Sig  . norethindrone-ethinyl estradiol (FEMHRT 1/5) 1-5 MG-MCG TABS Take 1 tablet by mouth daily.   No current facility-administered medications on file prior to visit.    Review of Systems Per HPI unless specifically indicated above    Objective:    BP 110/64  Pulse 84  Temp(Src) 97.4 F (36.3 C) (Oral)  Wt 162 lb (73.483 kg)   SpO2 98%  LMP 04/18/2013  Physical Exam  Nursing note and vitals reviewed. Constitutional: She appears well-developed and well-nourished. No distress.  HENT:  Head: Normocephalic and atraumatic.  Right Ear: Hearing, tympanic membrane, external ear and ear canal normal.  Left Ear: Hearing, tympanic membrane, external ear and ear canal normal.  Nose: No mucosal edema or rhinorrhea. Right sinus exhibits no maxillary sinus tenderness and no frontal sinus tenderness. Left sinus exhibits no maxillary sinus tenderness and no frontal sinus tenderness.  Mouth/Throat: Uvula is midline, oropharynx is clear and moist and mucous membranes are normal. No oropharyngeal exudate, posterior oropharyngeal edema, posterior oropharyngeal erythema or tonsillar abscesses.  Eyes: Conjunctivae and EOM are normal. Pupils are equal, round, and reactive to light. No scleral icterus.  Neck: Normal range of motion. Neck supple. No thyromegaly present.  Swelling noted along SCM on left, tender/tight muscle Tightness mainly felt with lateral rotation bilaterally  Cardiovascular: Normal rate, regular rhythm, normal heart sounds and intact distal pulses.   No murmur heard. Pulmonary/Chest: Effort normal and breath sounds normal. No respiratory distress. She has no wheezes. She has no rales.  Lymphadenopathy:       Head (right side): No submental, no submandibular, no tonsillar, no preauricular and no posterior auricular adenopathy present.       Head (left side): Tonsillar adenopathy present. No submental, no submandibular, no preauricular and no posterior auricular adenopathy present.    She has cervical adenopathy.  Right cervical: No deep cervical adenopathy present.      Left cervical: Deep cervical adenopathy present.       Right: No supraclavicular adenopathy present.       Left: No supraclavicular adenopathy present.  Skin: Skin is warm and dry. No rash noted.  No nail changes       Assessment & Plan:    Problem List Items Addressed This Visit   Chest discomfort     This is most consistent with GERD as it does significantly improved with PPI.  However having some side effects to PPI so change to omeprazole.    Neck swelling - Primary     Anticipate reactive LAD from current viral infection vs ?myositis - check throat [place] to further evaluate this. Given endorsed skin hardening and tightness, check Scl-70 and ANA today. Will call pt with results. May use muscle relaxant for neck tightness endorsed. ?all GERD related.    Relevant Orders      CBC with Differential      High sensitivity CRP      US Soft Tissue Head/Neck      Anti-Scleroderma Antibody      ANA       Follow up plan: Return if symptoms worsen or fail to improve.

## 2013-05-13 ENCOUNTER — Ambulatory Visit
Admission: RE | Admit: 2013-05-13 | Discharge: 2013-05-13 | Disposition: A | Discharge status: Home / Self Care | Payer: BC Managed Care – PPO | Source: Ambulatory Visit | Attending: Family Medicine | Admitting: Family Medicine

## 2013-05-13 DIAGNOSIS — R221 Localized swelling, mass and lump, neck: Secondary | ICD-10-CM

## 2013-05-13 LAB — ANA: Anti Nuclear Antibody(ANA): NEGATIVE

## 2013-05-13 LAB — ANTI-SCLERODERMA ANTIBODY: Scleroderma (Scl-70) (ENA) Antibody, IgG: <1

## 2013-07-15 ENCOUNTER — Ambulatory Visit (INDEPENDENT_AMBULATORY_CARE_PROVIDER_SITE_OTHER): Payer: BC Managed Care – PPO | Admitting: Family Medicine

## 2013-07-15 ENCOUNTER — Encounter: Payer: Self-pay | Admitting: Family Medicine

## 2013-07-15 VITALS — BP 118/90 | HR 88 | Temp 98.5°F | Wt 165.5 lb

## 2013-07-15 DIAGNOSIS — F411 Generalized anxiety disorder: Secondary | ICD-10-CM

## 2013-07-15 DIAGNOSIS — R221 Localized swelling, mass and lump, neck: Secondary | ICD-10-CM

## 2013-07-15 DIAGNOSIS — F419 Anxiety disorder, unspecified: Secondary | ICD-10-CM

## 2013-07-15 DIAGNOSIS — R220 Localized swelling, mass and lump, head: Secondary | ICD-10-CM

## 2013-07-15 MED ORDER — CITALOPRAM HYDROBROMIDE 10 MG PO TABS: 10.0000 mg | ORAL_TABLET | Freq: Every day | ORAL | Status: DC

## 2013-07-15 NOTE — Assessment & Plan Note (Addendum)
?  reactive LAD from recent chest cold although no discrete LN palpated today. Will monitor for now, if persistent swelling over next 2-3 weeks, discussed notifying me for referral to ENT for further evaluation. Pt agrees with plan.

## 2013-07-15 NOTE — Progress Notes (Signed)
BP 118/90  Pulse 88  Temp(Src) 98.5 F (36.9 C) (Oral)  Wt 165 lb 8 oz (75.07 kg)  LMP 07/11/2013   CC: recheck neck, discuss anxiety  Subjective:    Patient ID: Jacqueline Vazquez, female    DOB: 05-27-1980, 33 y.o.   MRN: [id number]  HPI: Jacqueline Vazquez is a 33 y.o. female presenting on 07/15/2013 for Anxiety and Follow-up   GERD sxs are improved on omeprazole 40mg  daily.  Over last few years noticing worsening anxiety issues. Has always been worrier, now becoming more excessive worrying (spending hours at a time worrying about this). Stays worried about 45mo old daughter. Stays worried about neck swelling - see below. Anxiety was bad during pregnancy. Mother is also a Research officer, trade [employer]. Rare anxiety attacks - short winded, diaphoretic and nauseated. Easily cries. Affecting home (relationship) > work. Feels distracted and more irritable, more moody.  No depression, anhedonia, SI/HI. H/o depression in college. Previously on zoloft which caused weigh gain. Lexapro caused persistent dizziness and jaw clenching.  Seen here 05/12/2013 with swelling at left side of neck. [place] unrevealing. Swelling resolved. Mild allergy sxs on and off. Slightly more allergy sxs recently. Now swelling has returned. Recently getting over chest cold with cough and sore throat last few weeks. No pain, no further hoarseness. Quit smoking 12/2012. Wt Readings from Last 3 Encounters:  07/15/13 165 lb 8 oz (75.07 kg)  05/12/13 162 lb (73.483 kg)  02/04/13 157 lb 8 oz (71.442 kg)   Body mass index is 25.91 kg/(m^2).  Did have pharyngitis 01/2013 but RST and flu swabs negative.   ULTRASOUND OF HEAD/NECK SOFT TISSUES  TECHNIQUE:  Ultrasound examination of the head and neck soft tissues was performed in the area of clinical concern.  COMPARISON: None.  FINDINGS:  No solid or cystic mass is seen at the site in question. Normal appearing lymph nodes are present with normally fatty hila.  IMPRESSION:  No significant  abnormality is noted by ultrasound.  Electronically Signed  By: Ivar Drape M.D.  On: 05/13/2013 16:34   Relevant past medical, surgical, family and social history reviewed and updated as indicated.  Allergies and medications reviewed and updated. Current Outpatient Prescriptions on File Prior to Visit  Medication Sig  . norethindrone-ethinyl estradiol (FEMHRT 1/5) 1-5 MG-MCG TABS Take 1 tablet by mouth daily.  Marland Kitchen omeprazole (PRILOSEC) 40 MG capsule Take 1 capsule (40 mg total) by mouth daily.   No current facility-administered medications on file prior to visit.    Review of Systems Per HPI unless specifically indicated above    Objective:    BP 118/90  Pulse 88  Temp(Src) 98.5 F (36.9 C) (Oral)  Wt 165 lb 8 oz (75.07 kg)  LMP 07/11/2013  Physical Exam  Nursing note and vitals reviewed. Constitutional: She appears well-developed and well-nourished. No distress.  HENT:  Mouth/Throat: Oropharynx is clear and moist. No oropharyngeal exudate.  Eyes: Conjunctivae and EOM are normal. Pupils are equal, round, and reactive to light. No scleral icterus.  Neck: Normal range of motion. Neck supple. No thyromegaly present.  FROM at neck without limitation or pain Mild fullness on inspection L SCM without tenderness, fluctuance or erythema/induration  Cardiovascular: Normal rate, regular rhythm, normal heart sounds and intact distal pulses.   No murmur heard. Pulmonary/Chest: Effort normal and breath sounds normal. No respiratory distress. She has no wheezes. She has no rales.  Lymphadenopathy:    She has no cervical adenopathy.  Psychiatric: She has a normal mood and  affect. She does not exhibit a depressed mood.  Tears with discussion of stressors   Results for orders placed in visit on 05/12/13  CBC WITH DIFFERENTIAL      Result Value Ref Range   WBC 4.5  4.5 - 10.5 K/uL   RBC 4.25  3.87 - 5.11 Mil/uL   Hemoglobin 13.2  12.0 - 15.0 g/dL   HCT 39.5  36.0 - 46.0 %   MCV 92.8   78.0 - 100.0 fl   MCHC 33.5  30.0 - 36.0 g/dL   RDW 12.9  11.5 - 14.6 %   Platelets 187.0  150.0 - 400.0 K/uL   Neutrophils Relative % 52.6  43.0 - 77.0 %   Lymphocytes Relative 38.8  12.0 - 46.0 %   Monocytes Relative 7.6  3.0 - 12.0 %   Eosinophils Relative 0.4  0.0 - 5.0 %   Basophils Relative 0.6  0.0 - 3.0 %   Neutro Abs 2.4  1.4 - 7.7 K/uL   Lymphs Abs 1.8  0.7 - 4.0 K/uL   Monocytes Absolute 0.3  0.1 - 1.0 K/uL   Eosinophils Absolute 0.0  0.0 - 0.7 K/uL   Basophils Absolute 0.0  0.0 - 0.1 K/uL  HIGH SENSITIVITY CRP      Result Value Ref Range   CRP, High Sensitivity 2.870  0.000 - 5.000 mg/L  ANTI-SCLERODERMA ANTIBODY      Result Value Ref Range   Scleroderma (Scl-70) (ENA) Antibody, IgG <1.0 NEG  <1.0 NEG AI  ANA      Result Value Ref Range   ANA NEG  NEGATIVE      Assessment & Plan:   Problem List Items Addressed This Visit   Neck swelling     ?reactive LAD from recent chest cold although no discrete LN palpated today. Will monitor for now, if persistent swelling over next 2-3 weeks, discussed notifying me for referral to ENT for further evaluation. Pt agrees with plan.    Anxiety - Primary     Significantly worsening anxiety recently - discussed etiology of anxiety disorder as well as reviewed healthy stress relieving strategies as well as other treatment options. Pt desires trial of pharmacotherapy. Will start celexa 5mg  daily for first 1-2 wks with option to increase to 10mg . Provided with contact # for counseling for pt to call and schedule appt if desired. Pt agrees with plan. A total of 25 minutes were spent face-to-face with the patient during this encounter and over half of that time was spent on counseling and coordination of care    Relevant Medications      citalopram (CELEXA) tablet       Follow up plan: Return in about 2 months (around 09/14/2013), or if symptoms worsen or fail to improve, for follow up visit.

## 2013-07-15 NOTE — Assessment & Plan Note (Signed)
Significantly worsening anxiety recently - discussed etiology of anxiety disorder as well as reviewed healthy stress relieving strategies as well as other treatment options. Pt desires trial of pharmacotherapy. Will start celexa 5mg  daily for first 1-2 wks with option to increase to 10mg . Provided with contact # for counseling for pt to call and schedule appt if desired. Pt agrees with plan. A total of 25 minutes were spent face-to-face with the patient during this encounter and over half of that time was spent on counseling and coordination of care

## 2013-07-15 NOTE — Progress Notes (Signed)
Pre visit review using our clinic review tool, if applicable. No additional management support is needed unless otherwise documented below in the visit note. 

## 2013-07-15 NOTE — Patient Instructions (Addendum)
Let's give neck swelling some time to go down - if persistent call me for referral to ENT. For anxiety - work on healthy stress relieving strategies (try incorporating walking into routine). Let's start celexa 5mg  daily (1/2 tablet daily) with option to increase to a whole tablet after 1 week. Call Pervis Hocking or Clint Bolder for counseling if you'd like. Return to see me in 1-2 months for follow up.

## 2013-07-28 ENCOUNTER — Telehealth: Payer: Self-pay | Admitting: Family Medicine

## 2013-07-28 DIAGNOSIS — R221 Localized swelling, mass and lump, neck: Secondary | ICD-10-CM

## 2013-07-28 NOTE — Telephone Encounter (Signed)
Pt called, still has neck swelling and would like referral to [facility]NT.  Best days for her to be seen are tues-thur.  Best number to call pt is [phone] / lt

## 2013-07-29 NOTE — Telephone Encounter (Signed)
referral placed

## 2013-08-05 ENCOUNTER — Encounter: Payer: Self-pay | Admitting: Family Medicine

## 2013-08-31 ENCOUNTER — Other Ambulatory Visit: Payer: Self-pay | Admitting: Family Medicine

## 2013-09-16 ENCOUNTER — Ambulatory Visit: Payer: BC Managed Care – PPO | Admitting: Family Medicine

## 2013-10-28 ENCOUNTER — Ambulatory Visit (INDEPENDENT_AMBULATORY_CARE_PROVIDER_SITE_OTHER): Payer: BC Managed Care – PPO | Admitting: Family Medicine

## 2013-10-28 ENCOUNTER — Encounter: Payer: Self-pay | Admitting: Family Medicine

## 2013-10-28 VITALS — BP 103/65 | HR 116 | Temp 99.0°F | Wt 166.0 lb

## 2013-10-28 DIAGNOSIS — J069 Acute upper respiratory infection, unspecified: Secondary | ICD-10-CM | POA: Insufficient documentation

## 2013-10-28 LAB — POCT INFLUENZA A/B
INFLUENZA B, POC: NEGATIVE
Influenza A, POC: NEGATIVE

## 2013-10-28 LAB — POCT RAPID STREP A (OFFICE): Rapid Strep A Screen: NEGATIVE

## 2013-10-28 NOTE — Patient Instructions (Addendum)
Sounds like you have a viral upper respiratory infection. Antibiotics are not needed for this.  Viral infections usually take 7-10 days to resolve.  The cough can last a few weeks to go away. Continue advil 2-3 tablets with food for next several days Push fluids and plenty of rest. Please return if you are not improving as expected, or if you have persistent high fevers (>101.5) or worsening productive cough. Good to see you today. Call clinic with questions.

## 2013-10-28 NOTE — Progress Notes (Signed)
   BP 103/65  Pulse 116  Temp(Src) 99 F (37.2 C) (Tympanic)  Wt 166 lb (75.297 kg)  SpO2 98%   CC: cough  Subjective:    Patient ID: Jacqueline Vazquez, female    DOB: September 26, 1980, 33 y.o.   MRN: [id number]  HPI: Jacqueline Vazquez is a 33 y.o. female presenting on 10/28/2013 for Cough, Sore Throat and Fever   Sudden onset last night of chills, malaise, body aches, "hit me suddenly". Tmax 101.8. Mild ST. Mild cough. Some blood tinged sputum. Fevers/chills is main concern. No significant congestion.  Self treating with advil 400mg  Q4 hours.  No sick contacts at home. Husband smokes outside. No h/o asthma. No flu shot yet. 2yo child at home.  Relevant past medical, surgical, family and social history reviewed and updated as indicated.  Allergies and medications reviewed and updated. Current Outpatient Prescriptions on File Prior to Visit  Medication Sig  . citalopram (CELEXA) 10 MG tablet Take 1 tablet (10 mg total) by mouth daily.  . norethindrone-ethinyl estradiol (FEMHRT 1/5) 1-5 MG-MCG TABS Take 1 tablet by mouth daily.  Marland Kitchen omeprazole (PRILOSEC) 40 MG capsule TAKE 1 CAPSULE (40 MG TOTAL) BY MOUTH DAILY.   No current facility-administered medications on file prior to visit.    Review of Systems Per HPI unless specifically indicated above    Objective:    BP 103/65  Pulse 116  Temp(Src) 99 F (37.2 C) (Tympanic)  Wt 166 lb (75.297 kg)  SpO2 98%  Physical Exam  Nursing note and vitals reviewed. Constitutional: She appears well-developed and well-nourished. No distress.  Tired but nontoxic appearing  HENT:  Head: Normocephalic and atraumatic.  Right Ear: Hearing, tympanic membrane, external ear and ear canal normal.  Left Ear: Hearing, tympanic membrane, external ear and ear canal normal.  Nose: No mucosal edema or rhinorrhea.  Mouth/Throat: Uvula is midline and mucous membranes are normal. Posterior oropharyngeal edema and posterior oropharyngeal erythema present.  No tonsillar abscesses.  Some tonsillar exudates  Eyes: Conjunctivae and EOM are normal. Pupils are equal, round, and reactive to light. No scleral icterus.  Neck: Normal range of motion. Neck supple.  Cardiovascular: Regular rhythm, normal heart sounds and intact distal pulses.  Tachycardia present.   No murmur heard. Pulmonary/Chest: Effort normal and breath sounds normal. No respiratory distress. She has no wheezes. She has no rales.  Lymphadenopathy:    She has no cervical adenopathy.  Skin: Skin is warm and dry. No rash noted.       Assessment & Plan:   Problem List Items Addressed This Visit   Acute upper respiratory infections of unspecified site - Primary     Pharyngitis on exam, ?influenza Check RST today - negative Check flu swab today - negative Anticipate viral URI - discussed with patient. Supportive care as per instructions - update if not improving as expected.    Relevant Orders      POCT rapid strep A (Completed)      POCT Influenza A/B (Completed)       Follow up plan: Return if symptoms worsen or fail to improve.

## 2013-10-28 NOTE — Assessment & Plan Note (Addendum)
Pharyngitis on exam, ?influenza Check RST today - negative Check flu swab today - negative Anticipate viral URI - discussed with patient. Supportive care as per instructions - update if not improving as expected.

## 2013-10-28 NOTE — Progress Notes (Signed)
Pre visit review using our clinic review tool, if applicable. No additional management support is needed unless otherwise documented below in the visit note. 

## 2013-11-11 ENCOUNTER — Other Ambulatory Visit: Payer: Self-pay | Admitting: Family Medicine

## 2013-12-08 ENCOUNTER — Encounter: Payer: Self-pay | Admitting: Family Medicine

## 2014-01-20 ENCOUNTER — Other Ambulatory Visit: Payer: Self-pay | Admitting: Obstetrics and Gynecology

## 2014-01-21 LAB — CYTOLOGY - PAP

## 2014-06-08 ENCOUNTER — Other Ambulatory Visit: Payer: Self-pay | Admitting: Family Medicine

## 2014-08-04 ENCOUNTER — Other Ambulatory Visit: Payer: Self-pay | Admitting: Family Medicine

## 2014-09-19 ENCOUNTER — Other Ambulatory Visit: Payer: Self-pay | Admitting: Family Medicine

## 2014-11-18 ENCOUNTER — Other Ambulatory Visit: Payer: Self-pay | Admitting: Family Medicine

## 2014-12-20 ENCOUNTER — Other Ambulatory Visit: Payer: Self-pay | Admitting: Family Medicine

## 2015-01-22 ENCOUNTER — Ambulatory Visit: Payer: Self-pay | Admitting: Family Medicine

## 2015-01-25 ENCOUNTER — Ambulatory Visit (INDEPENDENT_AMBULATORY_CARE_PROVIDER_SITE_OTHER)
Admission: RE | Admit: 2015-01-25 | Discharge: 2015-01-25 | Disposition: A | Discharge status: Home / Self Care | Payer: BLUE CROSS/BLUE SHIELD | Source: Ambulatory Visit | Attending: Family Medicine | Admitting: Family Medicine

## 2015-01-25 ENCOUNTER — Encounter: Payer: Self-pay | Admitting: Family Medicine

## 2015-01-25 ENCOUNTER — Ambulatory Visit (INDEPENDENT_AMBULATORY_CARE_PROVIDER_SITE_OTHER): Payer: BLUE CROSS/BLUE SHIELD | Admitting: Family Medicine

## 2015-01-25 VITALS — BP 118/86 | HR 72 | Temp 98.5°F | Wt 204.2 lb

## 2015-01-25 DIAGNOSIS — M79671 Pain in right foot: Secondary | ICD-10-CM

## 2015-01-25 NOTE — Progress Notes (Signed)
Pre visit review using our clinic review tool, if applicable. No additional management support is needed unless otherwise documented below in the visit note. 

## 2015-01-25 NOTE — Progress Notes (Signed)
   BP 118/86 mmHg  Pulse 72  Temp(Src) 98.5 F (36.9 C) (Oral)  Wt 204 lb 4 oz (92.647 kg)   CC: R foot swelling  Subjective:    Patient ID: Jacqueline Vazquez, female    DOB: 1980/03/16, 34 y.o.   MRN: [id number]  HPI: Jacqueline Vazquez is a 34 y.o. female presenting on 01/25/2015 for Foot Swelling   3wk h/o R foot swelling that comes and goes - points to dorsal midfoot. Denies inciting trauma, injury. Has tried elevation, ice, advil. Feels better with more supportive shoes. Worse with heels. Mild paresthesias but no numbness or weakness. Worse with standing on toes. No redness or warmth of foot. No other joint pain.  Had cyst removed from lateral foot as 34 yo.  Lots of sitting and standing at work.  Did dance as a child 24yo - 55yo.   Relevant past medical, surgical, family and social history reviewed and updated as indicated. Interim medical history since our last visit reviewed. Allergies and medications reviewed and updated. Current Outpatient Prescriptions on File Prior to Visit  Medication Sig  . citalopram (CELEXA) 10 MG tablet TAKE 1 TABLET (10 MG TOTAL) BY MOUTH DAILY.  Marland Kitchen norethindrone-ethinyl estradiol (FEMHRT 1/5) 1-5 MG-MCG TABS Take 1 tablet by mouth daily.   No current facility-administered medications on file prior to visit.    Review of Systems Per HPI unless specifically indicated in ROS section     Objective:    BP 118/86 mmHg  Pulse 72  Temp(Src) 98.5 F (36.9 C) (Oral)  Wt 204 lb 4 oz (92.647 kg)  Wt Readings from Last 3 Encounters:  01/25/15 204 lb 4 oz (92.647 kg)  10/28/13 166 lb (75.297 kg)  07/15/13 165 lb 8 oz (75.07 kg)    Physical Exam  Constitutional: She is oriented to person, place, and time. She appears well-developed and well-nourished. No distress.  Musculoskeletal: She exhibits edema (mild R dorsal midfoot).  2 + DP bilaterally.  Marked TTP R 2nd/3rd mid MT shafts No pain with calcaneal squeeze, no ligament laxity, no pain at  soles or heels  Neurological: She is alert and oriented to person, place, and time.  sensation intact  Skin: Skin is warm and dry. No rash noted. No erythema.  Nursing note and vitals reviewed.      Assessment & Plan:   Problem List Items Addressed This Visit    Right foot pain - Primary    High suspicion for stress fracture, less likely morton's neuroma as no numbness or sensation changes, less likely cyst. Xray today.  Pt states able to walk with only some pain.  Will place in post op shoe for 6 wks, rec tylenol for pain, RTC 1 mo for f/u.  Pt declines stronger pain medication.       Relevant Orders   DG Foot Complete Right       Follow up plan: Return in about 4 weeks (around 02/22/2015), or as needed, for follow up visit.

## 2015-01-25 NOTE — Assessment & Plan Note (Addendum)
High suspicion for stress fracture, less likely morton's neuroma as no numbness or sensation changes, less likely cyst. Xray today.  Pt states able to walk with only some pain.  Will place in post op shoe for 6 wks, rec tylenol for pain, RTC 1 mo for f/u.  Pt declines stronger pain medication.

## 2015-01-25 NOTE — Patient Instructions (Addendum)
I am suspicious for stress fracture of foot.  Limit walking. Use post op shoe for walking.  Continue rest and elevation of leg.  Use tylenol for pain, stop ibuprofen. If worse pain, let us know for stronger pain medicine. Return in 1 month for follow up visit. Good to see you today, call us with quesitons.  Stress Fracture Stress fracture is a small break or crack in a bone. A stress fracture can be fully broken (complete) or partially broken (incomplete). The most common sites for stress fractures are the bones in the front of your feet (metatarsals), your heels (calcaneus), and the long bone of your lower leg (tibia). CAUSES A stress fracture is caused by overuse or repetitive exercise, such as running. It happens when a bone cannot absorb any more shock because the muscles around it are weak. Stress fractures happen most commonly when:  You rapidly increase or start a new physical activity.  You use shoes that are worn out or do not fit you properly.  You exercise on a new surface. RISK FACTORS You may be at higher risk for this type of fracture if:  You have a condition that causes weak bones (osteoporosis).  You are female. Stress fractures are more likely to occur in women. SIGNS AND SYMPTOMS The most common symptom of a stress fracture is feeling pain when you are using the affected part of your body. The pain usually goes away when you are resting. Other symptoms may include:  Swelling of the affected area.  Pain in the area when it is touched.  Decreased pain while resting. Stress fracture pain usually develops over time. DIAGNOSIS Diagnosis may include:   Medical history and physical exam.  X-rays.  Bone scan.  MRI. TREATMENT Treatment depends on the severity of your stress fracture. Treatment usually involves resting, icing, compression, and elevation (RICE) of the affected part of your body. Treatment may also include:  Medicines to reduce inflammation.  A  cast or a walking shoe.  Crutches.  Surgery. HOME CARE INSTRUCTIONS If You Have a Cast:  Do not stick anything inside the cast to scratch your skin. Doing that increases your risk of infection.  Check the skin around the cast every day. Report any concerns to your health care provider. You may put lotion on dry skin around the edges of the cast. Do not apply lotion to the skin underneath the cast.  Keep the cast clean and dry.  Cover the cast with a watertight plastic bag to protect it from water while you take a bath or a shower. Do not let the cast get wet.  Do not put pressure on any part of the cast until it is fully hardened. This may take several hours. If You Have a Walking Shoe:  Wear it as directed by your health care provider. Managing Pain, Stiffness, and Swelling  If directed, apply ice to the injured area:  Put ice in a plastic bag.  Place a towel between your skin and the bag.  Leave the ice on for 20 minutes, 2-3 times per day.  Move your fingers or toes often to avoid stiffness and to lessen swelling.  Raise the injured area above the level of your heart while you are sitting or lying down. Activity  Rest as directed by your health care provider. Ask your health care provider if you may do alternative exercises, such as swimming or biking, while you are healing.  Return to your normal activities as directed  by your health care provider. Ask your health care provider what activities are safe for you.  Perform range-of-motion exercises only as directed by your health care provider. Safety  Do not use the injured limb to support yourbody weight until your health care provider says that you can. Use crutches if your health care provider tells you to do so. General Instructions  Do not use any tobacco products, including cigarettes, chewing tobacco, or electronic cigarettes. Tobacco can delay bone healing. If you need help quitting, ask your health care  provider.  Take medicines only as directed by your health care provider.  Keep all follow-up visits as directed by your health care provider. This is important. PREVENTION  Only wear shoes that:  Fit well.  Are not worn out.  Eat a healthy diet that contains vitamin D and calcium. This helps keeps your bones strong.  Be careful when you start a new physical activity. Give your body time to adjust.  Avoid doing only one kind of activity. Do different exercises, such as swimming and running, so that no single part of your body gets overused.  Do strength-training exercises. SEEK MEDICAL CARE IF:  Your pain gets worse.  You have new symptoms.  You have increased swelling. SEEK IMMEDIATE MEDICAL CARE IF:   You lose feeling in the affected area.   This information is not intended to replace advice given to you by your health care provider. Make sure you discuss any questions you have with your health care provider.   Document Released: 04/15/2002 Document Revised: 02/13/2014 Document Reviewed: 08/28/2013 Elsevier Interactive Patient Education [employer].

## 2015-02-22 ENCOUNTER — Ambulatory Visit: Payer: BLUE CROSS/BLUE SHIELD | Admitting: Family Medicine

## 2015-02-24 ENCOUNTER — Ambulatory Visit (INDEPENDENT_AMBULATORY_CARE_PROVIDER_SITE_OTHER): Payer: BLUE CROSS/BLUE SHIELD | Admitting: Family Medicine

## 2015-02-24 ENCOUNTER — Encounter: Payer: Self-pay | Admitting: Family Medicine

## 2015-02-24 VITALS — BP 110/80 | HR 76 | Temp 98.2°F | Wt 203.5 lb

## 2015-02-24 DIAGNOSIS — M79671 Pain in right foot: Secondary | ICD-10-CM | POA: Diagnosis not present

## 2015-02-24 NOTE — Progress Notes (Signed)
   BP 110/80 mmHg  Pulse 76  Temp(Src) 98.2 F (36.8 C) (Oral)  Wt 203 lb 8 oz (92.307 kg)   CC: f/u visit  Subjective:    Patient ID: Jacqueline Vazquez, female    DOB: [birth year], 35 y.o.   MRN: [id number]  HPI: Jacqueline Vazquez is a 35 y.o. female presenting on 02/24/2015 for Follow-up   See prior note for details.  Seen here 01/25/2015 with R foot pain. Treated as stress fracture with post op shoe x6 wks. Xray ok. Returns for f/u. Pain has improved but knot remains. No paresthesias.   Relevant past medical, surgical, family and social history reviewed and updated as indicated. Interim medical history since our last visit reviewed. Allergies and medications reviewed and updated. Current Outpatient Prescriptions on File Prior to Visit  Medication Sig  . citalopram (CELEXA) 10 MG tablet TAKE 1 TABLET (10 MG TOTAL) BY MOUTH DAILY.  Marland Kitchen norethindrone-ethinyl estradiol (FEMHRT 1/5) 1-5 MG-MCG TABS Take 1 tablet by mouth daily.   No current facility-administered medications on file prior to visit.    Review of Systems Per HPI unless specifically indicated in ROS section     Objective:    BP 110/80 mmHg  Pulse 76  Temp(Src) 98.2 F (36.8 C) (Oral)  Wt 203 lb 8 oz (92.307 kg)  Wt Readings from Last 3 Encounters:  02/24/15 203 lb 8 oz (92.307 kg)  01/25/15 204 lb 4 oz (92.647 kg)  10/28/13 166 lb (75.297 kg)    Physical Exam  Constitutional: She appears well-developed and well-nourished. No distress.  Musculoskeletal: She exhibits no edema.  2+ DP No pain to palpation mid dorsal foot  Mild swelling dorsal 2nd/3rd mid MT   Nursing note and vitals reviewed.   RIGHT FOOT COMPLETE - 3+ VIEW COMPARISON: No prior. FINDINGS: There is no evidence of fracture or dislocation. There is no evidence of arthropathy or other focal bone abnormality. Soft tissues are unremarkable. IMPRESSION: Negative. Electronically Signed  By: Marcello Moores Register  On: 01/25/2015 08:49      Assessment & Plan:   Problem List Items Addressed This Visit    Right foot pain - Primary    Marked improvement - no further pain - after 4 wks post op shoe. rec continued post op shoe x 1-2 wks and update if recurrent pain/swelling for further eval (imaging vs podiatry referral). Pt agrees with plan. Discussed upcoming trip to [place].          Follow up plan: Return if symptoms worsen or fail to improve.

## 2015-02-24 NOTE — Patient Instructions (Signed)
I'm glad foot is doing better. Continue post op shoe for 1-2 wks (ok to not wear in [place]).  Let me know if recurrent pain and we may either get another imaging study or refer to podiatrist.

## 2015-02-24 NOTE — Progress Notes (Signed)
Pre visit review using our clinic review tool, if applicable. No additional management support is needed unless otherwise documented below in the visit note. 

## 2015-02-24 NOTE — Assessment & Plan Note (Signed)
Marked improvement - no further pain - after 4 wks post op shoe. rec continued post op shoe x 1-2 wks and update if recurrent pain/swelling for further eval (imaging vs podiatry referral). Pt agrees with plan. Discussed upcoming trip to [place].

## 2015-04-26 ENCOUNTER — Encounter: Payer: Self-pay | Admitting: Family Medicine

## 2015-04-26 ENCOUNTER — Ambulatory Visit (INDEPENDENT_AMBULATORY_CARE_PROVIDER_SITE_OTHER): Payer: BLUE CROSS/BLUE SHIELD | Admitting: Family Medicine

## 2015-04-26 VITALS — BP 138/74 | HR 86 | Temp 98.9°F | Ht 67.0 in | Wt 199.2 lb

## 2015-04-26 DIAGNOSIS — J069 Acute upper respiratory infection, unspecified: Secondary | ICD-10-CM | POA: Insufficient documentation

## 2015-04-26 DIAGNOSIS — B9789 Other viral agents as the cause of diseases classified elsewhere: Principal | ICD-10-CM

## 2015-04-26 MED ORDER — GUAIFENESIN-CODEINE 100-10 MG/5ML PO SYRP: 5.0000 mL | ORAL_SOLUTION | Freq: Four times a day (QID) | ORAL | Status: DC | PRN

## 2015-04-26 MED ORDER — ALBUTEROL SULFATE HFA 108 (90 BASE) MCG/ACT IN AERS: 2.0000 | INHALATION_SPRAY | RESPIRATORY_TRACT | Status: DC | PRN

## 2015-04-26 MED ORDER — BENZONATATE 200 MG PO CAPS: 200.0000 mg | ORAL_CAPSULE | Freq: Three times a day (TID) | ORAL | Status: DC | PRN

## 2015-04-26 NOTE — Progress Notes (Signed)
Subjective:    Patient ID: Jacqueline Vazquez, female    DOB: 06-Apr-1980, 35 y.o.   MRN: [id number]  HPI  Here with uri symptoms   Wed/thurs- hoarse voice  Tired Cough- phlegm is green  Some sinus pressure and blowing out yellow   No fever - felt feverish / chills but no temp   Throat has been mildly sore on and off L ear popping  Taking advil cold and sinus   Symptoms are worse at night   Patient Active Problem List   Diagnosis Date Noted  . Right foot pain 01/25/2015  . Acute upper respiratory infections of unspecified site 10/28/2013  . Neck swelling 05/12/2013  . Chest discomfort 01/17/2013  . Anxiety 01/17/2013  . Leukopenia 01/17/2013  . Irritable bowel syndrome    Past Medical History  Diagnosis Date  . Irritable bowel syndrome   . Heartburn in pregnancy   . History of kidney stones 2012  . Hx of migraines   . History of depression college    resolved with 2 yrs zoloft  . Gestational hypertension   . Pregnancy complication     placenta accreta with hemorrhage with ICU stay, gest HTN   Past Surgical History  Procedure Laterality Date  . Wisdom tooth extraction    . Dilation and curettage of uterus  2011    miscarriage  . Colonoscopy  2008    IBS, int hemmorhoids, blood with BMs ([place])  . Esophagogastroduodenoscopy  2008    [facility])  . Cesarean section  11/23/2011    Surgeon: Marylynn Pearson, MD; PRIMARY   Social History  Substance Use Topics  . Smoking status: Former Smoker    Quit date: 12/15/2012  . Smokeless tobacco: Never Used  . Alcohol Use: No   Family History  Problem Relation Age of Onset  . Hypertension Father   . Diabetes Father   . Diabetes Maternal Grandmother   . Hyperlipidemia Father   . Depression Mother     with anxiety  . Cancer Maternal Grandfather     esophageal and lung (smoker)  . Heart disease Father     unsure details  . Cancer Sister     ovarian/cervical  . Stroke Neg Hx    Allergies  Allergen  Reactions  . Percocet [Oxycodone-Acetaminophen] Nausea And Vomiting  . Flagyl [Metronidazole Hcl] Rash   Current Outpatient Prescriptions on File Prior to Visit  Medication Sig Dispense Refill  . citalopram (CELEXA) 10 MG tablet TAKE 1 TABLET (10 MG TOTAL) BY MOUTH DAILY. 30 tablet 11  . norethindrone-ethinyl estradiol (FEMHRT 1/5) 1-5 MG-MCG TABS Take 1 tablet by mouth daily.     No current facility-administered medications on file prior to visit.     Review of Systems  Constitutional: Positive for appetite change and fatigue. Negative for fever.  HENT: Positive for congestion, postnasal drip, rhinorrhea, sinus pressure, sneezing and sore throat. Negative for ear discharge, ear pain and nosebleeds.   Eyes: Negative for pain and discharge.  Respiratory: Positive for cough. Negative for shortness of breath, wheezing and stridor.   Cardiovascular: Negative for chest pain.  Gastrointestinal: Negative for nausea, vomiting and diarrhea.  Genitourinary: Negative for urgency, frequency and hematuria.  Musculoskeletal: Negative for myalgias and arthralgias.  Skin: Negative for rash.  Neurological: Positive for headaches. Negative for dizziness, weakness and light-headedness.  Psychiatric/Behavioral: Negative for confusion and dysphoric mood.       Objective:   Physical Exam  Constitutional: She appears well-developed and well-nourished. No  distress.  overwt and well app  HENT:  Head: Normocephalic and atraumatic.  Right Ear: External ear normal.  Left Ear: External ear normal.  Mouth/Throat: Oropharynx is clear and moist.  Nares are injected and congested  No sinus tenderness Clear rhinorrhea and post nasal drip   Hoarse voice  Eyes: Conjunctivae and EOM are normal. Pupils are equal, round, and reactive to light. Right eye exhibits no discharge. Left eye exhibits no discharge.  Neck: Normal range of motion. Neck supple.  Cardiovascular: Normal rate and normal heart sounds.     Pulmonary/Chest: Effort normal and breath sounds normal. No respiratory distress. She has no wheezes. She has no rales. She exhibits no tenderness.  Harsh bs with occ rhonchi No wheezing   Lymphadenopathy:    She has no cervical adenopathy.  Neurological: She is alert.  Skin: Skin is warm and dry. No rash noted.  Psychiatric: She has a normal mood and affect.          Assessment & Plan:   Problem List Items Addressed This Visit      Respiratory   Viral URI with cough - Primary    Reassuring exam  You have a viral upper respiratory infection  Drink lots of fluids/rest when you can  Robitussin AC is ok at night -when not working or driving Tessalon is three times daily  For day as well- mucinex (plain or DM) - when working or driving  Try the inhaler if you feel tight chest or wheezing  Update if not starting to improve in a week or if worsening

## 2015-04-26 NOTE — Progress Notes (Signed)
Pre visit review using our clinic review tool, if applicable. No additional management support is needed unless otherwise documented below in the visit note. 

## 2015-04-26 NOTE — Patient Instructions (Signed)
You have a viral upper respiratory infection  Drink lots of fluids/rest when you can  Robitussin AC is ok at night -when not working or driving Tessalon is three times daily  For day as well- mucinex (plain or DM) - when working or driving  Try the inhaler if you feel tight chest or wheezing  Update if not starting to improve in a week or if worsening

## 2015-04-26 NOTE — Assessment & Plan Note (Signed)
Reassuring exam  You have a viral upper respiratory infection  Drink lots of fluids/rest when you can  Robitussin AC is ok at night -when not working or driving Tessalon is three times daily  For day as well- mucinex (plain or DM) - when working or driving  Try the inhaler if you feel tight chest or wheezing  Update if not starting to improve in a week or if worsening

## 2015-07-26 ENCOUNTER — Encounter: Payer: Self-pay | Admitting: Family Medicine

## 2015-07-26 MED ORDER — CITALOPRAM HYDROBROMIDE 10 MG PO TABS: ORAL_TABLET | ORAL | Status: DC

## 2015-09-02 ENCOUNTER — Encounter: Payer: Self-pay | Admitting: Family Medicine

## 2015-09-02 ENCOUNTER — Ambulatory Visit (INDEPENDENT_AMBULATORY_CARE_PROVIDER_SITE_OTHER): Payer: BLUE CROSS/BLUE SHIELD | Admitting: Family Medicine

## 2015-09-02 DIAGNOSIS — F411 Generalized anxiety disorder: Secondary | ICD-10-CM

## 2015-09-02 MED ORDER — CITALOPRAM HYDROBROMIDE 10 MG PO TABS: ORAL_TABLET | ORAL | 3 refills | Status: DC

## 2015-09-02 NOTE — Patient Instructions (Addendum)
I'm glad you're doing well today! celexa refilled x 1 year.  Return as needed or in 1-2 years for physical.

## 2015-09-02 NOTE — Progress Notes (Signed)
Pre visit review using our clinic review tool, if applicable. No additional management support is needed unless otherwise documented below in the visit note. 

## 2015-09-02 NOTE — Progress Notes (Signed)
   BP 126/74   Pulse 84   Temp 98.3 F (36.8 C) (Oral)   Wt 205 lb 12 oz (93.3 kg)   BMI 32.23 kg/m    CC: med refill visit Subjective:    Patient ID: Jacqueline Vazquez, female    DOB: [birth year], 35 y.o.   MRN: [id number]  HPI: Jacqueline Vazquez is a 35 y.o. female presenting on 09/02/2015 for Medication Refill   Here for celexa med refill. Takes this for GAD - stable over last 2 years on this regimen. Has noted marked imporvement in anxiety levels since starting medication. Sleeping well. Some weight gain noted. Does not desire change in regimen. Not interested in taper off at this point.   Sees OBGYN regularly for well woman exam. Regularly with OCP.   Relevant past medical, surgical, family and social history reviewed and updated as indicated. Interim medical history since our last visit reviewed. Allergies and medications reviewed and updated. Current Outpatient Prescriptions on File Prior to Visit  Medication Sig  . norethindrone-ethinyl estradiol (FEMHRT 1/5) 1-5 MG-MCG TABS Take 1 tablet by mouth daily.   No current facility-administered medications on file prior to visit.     Review of Systems Per HPI unless specifically indicated in ROS section     Objective:    BP 126/74   Pulse 84   Temp 98.3 F (36.8 C) (Oral)   Wt 205 lb 12 oz (93.3 kg)   BMI 32.23 kg/m   Wt Readings from Last 3 Encounters:  09/02/15 205 lb 12 oz (93.3 kg)  04/26/15 199 lb 4 oz (90.4 kg)  02/24/15 203 lb 8 oz (92.3 kg)    Physical Exam  Constitutional: She appears well-developed and well-nourished. No distress.  HENT:  Mouth/Throat: Oropharynx is clear and moist. No oropharyngeal exudate.  Eyes: Conjunctivae are normal. Pupils are equal, round, and reactive to light.  Cardiovascular: Normal rate, regular rhythm, normal heart sounds and intact distal pulses.   No murmur heard. Pulmonary/Chest: Effort normal and breath sounds normal. No respiratory distress. She has no wheezes. She  has no rales.  Musculoskeletal: She exhibits no edema.  Psychiatric: She has a normal mood and affect.  Nursing note and vitals reviewed.     Assessment & Plan:   Problem List Items Addressed This Visit    GAD (generalized anxiety disorder)    Chronic, stable on current regimen, desires to continue. Tolerating medication well without side effects, efficacious to treat anxiety.  Celexa refilled 10mg  daily x 90 days with RF3.  RTC 1-2 yrs CPE.        Other Visit Diagnoses   None.      Follow up plan: No Follow-up on file.  Ria Bush, MD

## 2015-09-02 NOTE — Assessment & Plan Note (Signed)
Chronic, stable on current regimen, desires to continue. Tolerating medication well without side effects, efficacious to treat anxiety.  Celexa refilled 10mg  daily x 90 days with RF3.  RTC 1-2 yrs CPE.

## 2016-02-15 DIAGNOSIS — Z6833 Body mass index (BMI) 33.0-33.9, adult: Secondary | ICD-10-CM | POA: Diagnosis not present

## 2016-02-15 DIAGNOSIS — Z01419 Encounter for gynecological examination (general) (routine) without abnormal findings: Secondary | ICD-10-CM | POA: Diagnosis not present

## 2016-03-02 DIAGNOSIS — R1031 Right lower quadrant pain: Secondary | ICD-10-CM | POA: Diagnosis not present

## 2016-03-02 DIAGNOSIS — Z23 Encounter for immunization: Secondary | ICD-10-CM | POA: Diagnosis not present

## 2016-04-03 ENCOUNTER — Ambulatory Visit (INDEPENDENT_AMBULATORY_CARE_PROVIDER_SITE_OTHER): Payer: BLUE CROSS/BLUE SHIELD | Admitting: Family Medicine

## 2016-04-03 ENCOUNTER — Encounter: Payer: Self-pay | Admitting: Family Medicine

## 2016-04-03 VITALS — BP 126/86 | HR 92 | Temp 98.1°F | Wt 215.0 lb

## 2016-04-03 DIAGNOSIS — D1722 Benign lipomatous neoplasm of skin and subcutaneous tissue of left arm: Secondary | ICD-10-CM

## 2016-04-03 DIAGNOSIS — M79671 Pain in right foot: Secondary | ICD-10-CM

## 2016-04-03 DIAGNOSIS — D1720 Benign lipomatous neoplasm of skin and subcutaneous tissue of unspecified limb: Secondary | ICD-10-CM | POA: Insufficient documentation

## 2016-04-03 NOTE — Assessment & Plan Note (Signed)
Exam consistent with L arm lipoma - reassured.

## 2016-04-03 NOTE — Assessment & Plan Note (Signed)
Actually today exam more consistent with 2nd 3rd MTPJ capsulitis. Discussed with patient. rec continued post op shoe, NSAID at night, icing, will refer to podiatry given recurrent issue. Pt agrees with plan.

## 2016-04-03 NOTE — Progress Notes (Signed)
Pre visit review using our clinic review tool, if applicable. No additional management support is needed unless otherwise documented below in the visit note. 

## 2016-04-03 NOTE — Patient Instructions (Addendum)
Continue post op shoe Rest R foot We will refer you to podiatry for further evaluation.  Consider insoles with arch support.  Continue ibuprofen at night time.  Left arm lipoma.

## 2016-04-03 NOTE — Progress Notes (Signed)
   BP 126/86   Pulse 92   Temp 98.1 F (36.7 C) (Oral)   Wt 215 lb (97.5 kg)   BMI 33.67 kg/m    CC: R foot pain, L arm lump Subjective:    Patient ID: Jacqueline Vazquez, female    DOB: April 23, 1980, 36 y.o.   MRN: [id number]  HPI: Jacqueline Vazquez is a 36 y.o. female presenting on 04/03/2016 for Foot Pain (right foot; feels like previous stress fracture; has been wearing post-op shoe x2 weeks) and Check arm (left arm has knot; not painful; ? lipoma)   2-3 wk h/o R foot pain with dorsal swelling and small bruise at area. Worse pain with standing on toes, worse after long day on feet. Increased swelling at end of day. She does use ibuprofen 600mg  at night time and ices foot. Denies numbness, paresthesias, or electrical shock sensation of foot  Has started wearing post-op shoe over last 2 wks.  H/o R foot pain treated as stress fracture last year although xrays were ok - this feels similar.   Planned trip to [place] in April.   She would also like R arm lump evaluated - present >35yr at [facility] area, not enlarging or irritated. Nontender, not itchy.   Relevant past medical, surgical, family and social history reviewed and updated as indicated. Interim medical history since our last visit reviewed. Allergies and medications reviewed and updated. Outpatient Medications Prior to Visit  Medication Sig Dispense Refill  . citalopram (CELEXA) 10 MG tablet TAKE 1 TABLET (10 MG TOTAL) BY MOUTH DAILY. 90 tablet 3  . norethindrone-ethinyl estradiol (FEMHRT 1/5) 1-5 MG-MCG TABS Take 1 tablet by mouth daily.     No facility-administered medications prior to visit.      Per HPI unless specifically indicated in ROS section below Review of Systems     Objective:    BP 126/86   Pulse 92   Temp 98.1 F (36.7 C) (Oral)   Wt 215 lb (97.5 kg)   BMI 33.67 kg/m   Wt Readings from Last 3 Encounters:  04/03/16 215 lb (97.5 kg)  09/02/15 205 lb 12 oz (93.3 kg)  04/26/15 199 lb 4 oz (90.4 kg)      Physical Exam  Constitutional: She appears well-developed and well-nourished. No distress.  Musculoskeletal: She exhibits no edema.  2+ DP bilaterally Some loss of longitudinal arches bilaterally with standing L foot WNL R foot:  Tender to palpation at 2nd/3rd MCPJ and some at sole at 2nd/3rd MCPJ No pain at MT shaft or with axial loading of toes.  No significant transverse arch collapse  Skin: Skin is warm and dry. No rash noted. No erythema.  Circumscribed soft mass L AC of arm nontender, nonmobile  Nursing note and vitals reviewed.     Assessment & Plan:   Problem List Items Addressed This Visit    Lipoma of arm    Exam consistent with L arm lipoma - reassured.       Right foot pain - Primary    Actually today exam more consistent with 2nd 3rd MTPJ capsulitis. Discussed with patient. rec continued post op shoe, NSAID at night, icing, will refer to podiatry given recurrent issue. Pt agrees with plan.       Relevant Orders   Ambulatory referral to Podiatry       Follow up plan: Return if symptoms worsen or fail to improve.  Ria Bush, MD

## 2016-04-17 ENCOUNTER — Ambulatory Visit: Payer: BLUE CROSS/BLUE SHIELD | Admitting: Podiatry

## 2016-05-01 ENCOUNTER — Ambulatory Visit (INDEPENDENT_AMBULATORY_CARE_PROVIDER_SITE_OTHER): Payer: BLUE CROSS/BLUE SHIELD | Admitting: Podiatry

## 2016-05-01 ENCOUNTER — Encounter: Payer: Self-pay | Admitting: Podiatry

## 2016-05-01 ENCOUNTER — Ambulatory Visit (INDEPENDENT_AMBULATORY_CARE_PROVIDER_SITE_OTHER): Payer: BLUE CROSS/BLUE SHIELD

## 2016-05-01 DIAGNOSIS — M779 Enthesopathy, unspecified: Principal | ICD-10-CM

## 2016-05-01 DIAGNOSIS — M79671 Pain in right foot: Secondary | ICD-10-CM

## 2016-05-01 DIAGNOSIS — M7751 Other enthesopathy of right foot: Secondary | ICD-10-CM

## 2016-05-01 DIAGNOSIS — M778 Other enthesopathies, not elsewhere classified: Secondary | ICD-10-CM

## 2016-05-01 MED ORDER — MELOXICAM 15 MG PO TABS: 15.0000 mg | ORAL_TABLET | Freq: Every day | ORAL | 3 refills | Status: DC

## 2016-05-01 MED ORDER — METHYLPREDNISOLONE 4 MG PO TBPK: ORAL_TABLET | ORAL | 0 refills | Status: DC

## 2016-05-01 NOTE — Progress Notes (Signed)
   Subjective:    Patient ID: Jacqueline Vazquez, female    DOB: 20-Jun-1980, 36 y.o.   MRN: [id number]  HPI: She presents today with a chief complaint of pain to the forefoot right. She states it is been bothersome since February 2018. She initially had the same problem 12 2016. Primary care provider put her in an open toed surgical shoe for several weeks. She states that she would like to have this better because she will be leaving for [place] in 1 month.    Review of Systems  All other systems reviewed and are negative.      Objective:   Physical Exam: Vital signs are stable she is alert and oriented 3. Pulses are palpable. Neurologic sensorium is intact. Deep tendon reflexes are intact. Muscle strength +5 over 5 dorsiflexion plantar flexors and inverters everters on physical musculatures intact. Orthopedic evaluation demonstrates all joints distal to the ankle for range of motion without crepitation. She has pain on end range of motion of the second third digits of the right foot. Radiographs taken today do not demonstrate any type of osseus abnormalities in these areas. No fractures are identified. Cutaneous evaluation shows supple well-hydrated cutis no erythema edema cellulitis drainage or odor with exception of mild swelling overlying the forefoot.     Assessment & Plan:  Assessment: Capsulitis second metatarsophalangeal joint right foot.  Plan: I injected peri-articularly today with Kenalog and local anesthetic recommended that she continue to use her Darco shoe and discussed appropriate shoe gear stretching exercises ice therapy issue or modifications. Also placed her on methylprednisolone and meloxicam.

## 2016-05-24 ENCOUNTER — Ambulatory Visit: Payer: BLUE CROSS/BLUE SHIELD | Admitting: Podiatry

## 2016-07-31 ENCOUNTER — Telehealth: Payer: Self-pay

## 2016-07-31 NOTE — Telephone Encounter (Signed)
Noted, will evaluate. 

## 2016-07-31 NOTE — Telephone Encounter (Signed)
NOTE: Patient has an appointment tomorrow (08/01/16) at 8:45am with Alma Friendly, NP for the following team health triaged call:  Will forward to NP as FYI.   Patient Name: Jacqueline Vazquez Gender: Female DOB: [birth year] Age: 36 Y 28 D Return Phone Number: [phone] (Primary) City/State/Zip: [place] Client [place] Night - Client Client Site [place] Physician Ria Bush - MD Who Is Calling Patient / Member / Family / Caregiver Call Type Triage / Clinical Relationship To Patient Self Return Phone Number [phone] (Primary) Chief Complaint Hives Reason for Call Symptomatic / Request for [place] broke out yesterday in itchy hives all over her torso and her arms, some were really warm to the touch, benadryl gets rid of them but come back once it wears off. They are currently were her bra is. Nurse Assessment Nurse: Donovan Kail, RN, Barnetta Chapel Date/Time (Eastern Time): 07/31/2016 3:16:51 PM Confirm and document reason for call. If symptomatic, describe symptoms. ---Caller broke out yesterday in itchy hives all over her torso and her arms, some were really warm to the touch, benadryl gets rid of them but come back once it wears off. They are currently were her bra is. Everything but her legs. Her body is red red and arms were more pink and bumpy. No fever. They went to beach last week. She washed clothes in tide liquid. Does the PT have any chronic conditions? (i.e. diabetes, asthma, etc.) ---Yes List chronic conditions. ---sensitive skin, farmer's market, sampled blackberry wine. Is the patient pregnant or possibly pregnant? (Ask all females between the ages of 44-55) ---No Guidelines Guideline Title Affirmed Question Hives [1] MODERATE-SEVERE hives persist (i.e., hives interfere with normal activities or work) AND [2] taking antihistamine (e.g., Benadryl, Claritin) > 24  hours Disp. Time Eilene Ghazi Time) Disposition Final User 07/31/2016 3:26:25 PM See Physician within 24 Hours Yes Donovan Kail, RN, Barnetta Chapel Referrals REFERRED TO PCP OFFICE Care Advice Given Per Guideline SEE PHYSICIAN WITHIN 24 HOURS: * IF OFFICE WILL BE OPEN: You need to be seen within the next 24 hours. Call your doctor when the office opens, and make an appointment. CETIRIZINE OR LORATADINE: * Take cetirizine (e.g., Zyrtec) or loratadine once each day for hives that itch. Continue taking it once a day until the hives are gone for 24 hours. * CETIRIZINE (e.g., Zyrtec): Adult dosage is 10 mg once each day. COOL BATH: Take a cool bath for 10 minutes to relieve itching. (Caution: avoid any chill) Rub very itchy areas with an ice cube for 10 minutes. REMOVE ALLERGENS: Take a bath or shower if triggered by pollens

## 2016-08-01 ENCOUNTER — Ambulatory Visit: Payer: BLUE CROSS/BLUE SHIELD | Admitting: Primary Care

## 2016-08-18 ENCOUNTER — Other Ambulatory Visit: Payer: Self-pay | Admitting: Family Medicine

## 2016-10-13 ENCOUNTER — Ambulatory Visit: Payer: BLUE CROSS/BLUE SHIELD | Admitting: Family Medicine

## 2016-10-13 ENCOUNTER — Encounter: Payer: Self-pay | Admitting: Family Medicine

## 2016-10-13 ENCOUNTER — Ambulatory Visit (INDEPENDENT_AMBULATORY_CARE_PROVIDER_SITE_OTHER): Payer: BLUE CROSS/BLUE SHIELD | Admitting: Family Medicine

## 2016-10-13 VITALS — BP 116/84 | HR 97 | Temp 97.7°F | Wt 215.8 lb

## 2016-10-13 DIAGNOSIS — J029 Acute pharyngitis, unspecified: Secondary | ICD-10-CM | POA: Diagnosis not present

## 2016-10-13 LAB — POCT RAPID STREP A (OFFICE): RAPID STREP A SCREEN: NEGATIVE

## 2016-10-13 NOTE — Progress Notes (Signed)
BP 116/84   Pulse 97   Temp 97.7 F (36.5 C) (Oral)   Wt 215 lb 12 oz (97.9 kg)   SpO2 98%   BMI 33.79 kg/m    CC: R ear pain Subjective:    Patient ID: Jacqueline Vazquez, female    DOB: 07-Sep-1980, 36 y.o.   MRN: [id number]  HPI: Jacqueline Vazquez is a 36 y.o. female presenting on 10/13/2016 for Ear Pain (right)   Several day h/o R earache, R throat and neck pain. Acutely worse last night. Staying hot and some chills. Minimal cough. Some throat congestion. Mild facial pain and PNDrainage.   No fevers, tooth pain, dyspnea or wheezing.  No hoarse voice, no trismus.  Treating with advil cold/sinus and salt water gargles.   No sick contacts at home.  Ex smoker.  No h/o asthma.   Relevant past medical, surgical, family and social history reviewed and updated as indicated. Interim medical history since our last visit reviewed. Allergies and medications reviewed and updated. Outpatient Medications Prior to Visit  Medication Sig Dispense Refill  . citalopram (CELEXA) 10 MG tablet TAKE 1 TABLET (10 MG TOTAL) BY MOUTH DAILY. 90 tablet 2  . LO LOESTRIN FE 1 MG-10 MCG / 10 MCG tablet Take 1 tablet by mouth daily. as directed  4  . meloxicam (MOBIC) 15 MG tablet Take 1 tablet (15 mg total) by mouth daily. 30 tablet 3  . methylPREDNISolone (MEDROL DOSEPAK) 4 MG TBPK tablet 6 day dose pack - take as directed 21 tablet 0   No facility-administered medications prior to visit.      Per HPI unless specifically indicated in ROS section below Review of Systems     Objective:    BP 116/84   Pulse 97   Temp 97.7 F (36.5 C) (Oral)   Wt 215 lb 12 oz (97.9 kg)   SpO2 98%   BMI 33.79 kg/m   Wt Readings from Last 3 Encounters:  10/13/16 215 lb 12 oz (97.9 kg)  04/03/16 215 lb (97.5 kg)  09/02/15 205 lb 12 oz (93.3 kg)    Physical Exam  Constitutional: She appears well-developed and well-nourished. No distress.  HENT:  Head: Normocephalic and atraumatic.  Right Ear: Hearing,  tympanic membrane, external ear and ear canal normal.  Left Ear: Hearing, tympanic membrane, external ear and ear canal normal.  Nose: No mucosal edema or rhinorrhea. Right sinus exhibits no maxillary sinus tenderness and no frontal sinus tenderness. Left sinus exhibits no maxillary sinus tenderness and no frontal sinus tenderness.  Mouth/Throat: Uvula is midline and mucous membranes are normal. Posterior oropharyngeal edema and posterior oropharyngeal erythema present. No oropharyngeal exudate or tonsillar abscesses.  R tonsillar and posterior oropharyngeal erythema and edema without obvious exudates  Eyes: Pupils are equal, round, and reactive to light. Conjunctivae and EOM are normal. No scleral icterus.  Neck: Normal range of motion. Neck supple. No tracheal deviation present. No thyromegaly present.  Cardiovascular: Normal rate, regular rhythm, normal heart sounds and intact distal pulses.   No murmur heard. Pulmonary/Chest: Effort normal and breath sounds normal. No respiratory distress. She has no wheezes. She has no rales.  Lymphadenopathy:       Head (right side): Tonsillar adenopathy present. No submental, no submandibular, no preauricular and no posterior auricular adenopathy present.       Head (left side): No submental, no submandibular, no tonsillar, no preauricular and no posterior auricular adenopathy present.    She has cervical adenopathy.  Right cervical: Superficial cervical adenopathy present.       Left cervical: No superficial cervical adenopathy present.       Right: No supraclavicular adenopathy present.       Left: No supraclavicular adenopathy present.  Skin: Skin is warm and dry. No rash noted.  Nursing note and vitals reviewed.  Results for orders placed or performed in visit on 10/13/16  Rapid Strep A  Result Value Ref Range   Rapid Strep A Screen Negative Negative      Assessment & Plan:   Problem List Items Addressed This Visit    Acute pharyngitis -  Primary    Significant discomfort and erythema/edema.  RST today negative. Anticipate viral pharyngitis.  Supportive care reviewed.  Red flags to seek urgent care reviewed.  Pt agrees with plan.       Relevant Orders   Rapid Strep A (Completed)       Follow up plan: Return if symptoms worsen or fail to improve.  Ria Bush, MD

## 2016-10-13 NOTE — Assessment & Plan Note (Signed)
Significant discomfort and erythema/edema.  RST today negative. Anticipate viral pharyngitis.  Supportive care reviewed.  Red flags to seek urgent care reviewed.  Pt agrees with plan.

## 2016-10-13 NOTE — Patient Instructions (Addendum)
Strep test today negative.  You have acute pharyngitis, but likely viral.  Push fluids and plenty of rest.  May use ibuprofen 400-600mg  with meals for throat inflammation. Salt water gargles. Suck on cold things like popsicles or warm things like herbal teas (whichever soothes the throat better). Return if fever >101.5, worsening pain and swelling, or trouble opening/closing mouth, or hoarse voice.  Good to see you today, call clinic with questions.

## 2016-11-21 ENCOUNTER — Encounter: Payer: Self-pay | Admitting: Family Medicine

## 2016-11-21 ENCOUNTER — Ambulatory Visit (INDEPENDENT_AMBULATORY_CARE_PROVIDER_SITE_OTHER): Payer: BLUE CROSS/BLUE SHIELD | Admitting: Family Medicine

## 2016-11-21 VITALS — BP 116/92 | HR 91 | Temp 97.6°F | Ht 67.0 in | Wt 221.4 lb

## 2016-11-21 DIAGNOSIS — L089 Local infection of the skin and subcutaneous tissue, unspecified: Secondary | ICD-10-CM

## 2016-11-21 DIAGNOSIS — T07XXXA Unspecified multiple injuries, initial encounter: Secondary | ICD-10-CM | POA: Diagnosis not present

## 2016-11-21 DIAGNOSIS — W57XXXA Bitten or stung by nonvenomous insect and other nonvenomous arthropods, initial encounter: Secondary | ICD-10-CM | POA: Diagnosis not present

## 2016-11-21 DIAGNOSIS — Z23 Encounter for immunization: Secondary | ICD-10-CM

## 2016-11-21 MED ORDER — DOXYCYCLINE HYCLATE 100 MG PO TABS: 100.0000 mg | ORAL_TABLET | Freq: Two times a day (BID) | ORAL | 0 refills | Status: DC

## 2016-11-21 NOTE — Assessment & Plan Note (Signed)
Doxycyline 100 mg twice daily x 7 days. Call or return to clinic prn if these symptoms worsen or fail to improve as anticipated. The patient indicates understanding of these issues and agrees with the plan.

## 2016-11-21 NOTE — Patient Instructions (Signed)

## 2016-11-21 NOTE — Progress Notes (Signed)
Subjective:   Patient ID: Jacqueline Vazquez, female    DOB: [birth year], 36 y.o.   MRN: [id number]  Jacqueline Vazquez is a pleasant 36 y.o. year old female who presents to clinic today with Insect Bite (Patient is here today C/O swelling caused by fire ant bites to right foot on 10.13.2018.  )  on 11/21/2016  HPI:  Bitten by fire ants 3 days ago on her right foot.  Since then, foot is swollen, warm, tender and she feels the warmth and redness is spreading.  No drainage from the bites.  No fevers or chills.  No n/v/d.   Current Outpatient Prescriptions on File Prior to Visit  Medication Sig Dispense Refill  . citalopram (CELEXA) 10 MG tablet TAKE 1 TABLET (10 MG TOTAL) BY MOUTH DAILY. 90 tablet 2  . LO LOESTRIN FE 1 MG-10 MCG / 10 MCG tablet Take 1 tablet by mouth daily. as directed  4   No current facility-administered medications on file prior to visit.     Allergies  Allergen Reactions  . Percocet [Oxycodone-Acetaminophen] Nausea And Vomiting  . Flagyl [Metronidazole Hcl] Rash    Past Medical History:  Diagnosis Date  . Gestational hypertension   . Heartburn in pregnancy   . History of depression college   resolved with 2 yrs zoloft  . History of kidney stones 2012  . Hx of migraines   . Irritable bowel syndrome   . Pregnancy complication    placenta accreta with hemorrhage with ICU stay, gest HTN    Past Surgical History:  Procedure Laterality Date  . CESAREAN SECTION  11/23/2011   Surgeon: Marylynn Pearson, MD; PRIMARY  . COLONOSCOPY  2008   IBS, int hemmorhoids, blood with BMs ([place])  . DILATION AND CURETTAGE OF UTERUS  2011   miscarriage  . ESOPHAGOGASTRODUODENOSCOPY  2008   [facility])  . WISDOM TOOTH EXTRACTION      Family History  Problem Relation Age of Onset  . Hypertension Father   . Diabetes Father   . Diabetes Maternal Grandmother   . Hyperlipidemia Father   . Depression Mother        with anxiety  . Cancer Maternal Grandfather         esophageal and lung (smoker)  . Heart disease Father        unsure details  . Cancer Sister        ovarian/cervical  . Stroke Neg Hx     Social History   Social History  . Marital status: Married    Spouse name: N/A  . Number of children: N/A  . Years of education: N/A   Occupational History  . Not on file.   Social History Main Topics  . Smoking status: Former Smoker    Quit date: 12/15/2012  . Smokeless tobacco: Never Used  . Alcohol use No  . Drug use: No  . Sexual activity: Yes    Birth control/ protection: None     Comment: quit smoking with preg   Other Topics Concern  . Not on file   Social History Narrative   Lives with husband and daughter, 1 dog   Occupation: Location manager   Edu: college   Activity: no regular exercise   Diet:    A+ blood type   The PMH, PSH, Social History, Family History, Medications, and allergies have been reviewed in [facility], and have been updated if relevant.   Review of Systems  Constitutional: Negative.   Respiratory:  Negative.   Cardiovascular: Negative.   Gastrointestinal: Negative.   Genitourinary: Negative.   Musculoskeletal: Negative.   Skin: Positive for color change and wound.  Neurological: Negative.   Psychiatric/Behavioral: Negative.   All other systems reviewed and are negative.      Objective:    BP (!) 116/92 (BP Location: Left Arm, Patient Position: Sitting, Cuff Size: Normal)   Pulse 91   Temp 97.6 F (36.4 C) (Oral)   Ht 5\' 7"  (1.702 m)   Wt 221 lb 6.4 oz (100.4 kg)   SpO2 99%   BMI 34.68 kg/m    Physical Exam  Constitutional: She is oriented to person, place, and time. She appears well-developed and well-nourished. No distress.  HENT:  Head: Atraumatic.  Eyes: Conjunctivae are normal.  Cardiovascular: Normal rate.   Pulmonary/Chest: Effort normal.  Musculoskeletal: Normal range of motion. She exhibits edema.  Neurological: She is alert and oriented to person, place, and  time. No cranial nerve deficit.  Skin: She is not diaphoretic.     Psychiatric: She has a normal mood and affect. Her behavior is normal. Thought content normal.  Nursing note and vitals reviewed.         Assessment & Plan:   Insect bites of multiple sites, infected No Follow-up on file.

## 2016-12-22 ENCOUNTER — Ambulatory Visit: Payer: BLUE CROSS/BLUE SHIELD | Admitting: Primary Care

## 2017-02-22 DIAGNOSIS — Z01419 Encounter for gynecological examination (general) (routine) without abnormal findings: Secondary | ICD-10-CM | POA: Diagnosis not present

## 2017-02-22 DIAGNOSIS — Z6835 Body mass index (BMI) 35.0-35.9, adult: Secondary | ICD-10-CM | POA: Diagnosis not present

## 2017-05-16 ENCOUNTER — Encounter: Payer: Self-pay | Admitting: Family Medicine

## 2017-05-16 ENCOUNTER — Ambulatory Visit: Payer: BLUE CROSS/BLUE SHIELD | Admitting: Family Medicine

## 2017-05-16 VITALS — BP 120/82 | HR 95 | Temp 98.0°F | Ht 67.0 in | Wt 231.0 lb

## 2017-05-16 DIAGNOSIS — F411 Generalized anxiety disorder: Secondary | ICD-10-CM | POA: Diagnosis not present

## 2017-05-16 DIAGNOSIS — J301 Allergic rhinitis due to pollen: Secondary | ICD-10-CM

## 2017-05-16 MED ORDER — LORATADINE 10 MG PO TABS: 10.0000 mg | ORAL_TABLET | Freq: Every day | ORAL | 11 refills | Status: DC

## 2017-05-16 MED ORDER — CITALOPRAM HYDROBROMIDE 20 MG PO TABS: 20.0000 mg | ORAL_TABLET | Freq: Every day | ORAL | 3 refills | Status: DC

## 2017-05-16 MED ORDER — FLUTICASONE PROPIONATE 50 MCG/ACT NA SUSP: 2.0000 | Freq: Every day | NASAL | 6 refills | Status: DC

## 2017-05-16 NOTE — Assessment & Plan Note (Signed)
Deteriorated this year.  Discussed non sedating antihistamine, intranasal steroid, and allergen avoidance. Rx flonase and claritin sent to pharmacy.  Update if not improving with treatment.

## 2017-05-16 NOTE — Progress Notes (Signed)
BP 120/82 (BP Location: Left Arm, Patient Position: Sitting, Cuff Size: Large)   Pulse 95   Temp 98 F (36.7 C) (Oral)   Ht 5\' 7"  (1.702 m)   Wt 231 lb (104.8 kg)   SpO2 98%   BMI 36.18 kg/m    CC: med refill, allergies Subjective:    Patient ID: Jacqueline Vazquez, female    DOB: 27-Oct-1980, 37 y.o.   MRN: [id number]  HPI: Jacqueline Vazquez is a 37 y.o. female presenting on 05/16/2017 for Medication Refill (Wants to discuss increasing strength of Celexa.) and Allergies (Wants to discuss rx for seasonal allergies.)   Anxiety/depression - increased anxiety noted and overthinking things, has doubled up on celexa as needed  Worsening seasonal allergies - especially noted since husband mowing grass over weekend. Itchy throat, congestion and rhinorrhea. Mild itchy eyes. Has been taking benadryl - which helps but is overly sedating. Has always struggled with   Well woman with OBGYN Dr [facility]van [facility] - last done 02/2017 normal pap, thinks had FLP at last visit.   Relevant past medical, surgical, family and social history reviewed and updated as indicated. Interim medical history since our last visit reviewed. Allergies and medications reviewed and updated. Outpatient Medications Prior to Visit  Medication Sig Dispense Refill  . LO LOESTRIN FE 1 MG-10 MCG / 10 MCG tablet Take 1 tablet by mouth daily. as directed  4  . citalopram (CELEXA) 10 MG tablet TAKE 1 TABLET (10 MG TOTAL) BY MOUTH DAILY. 90 tablet 2  . doxycycline (VIBRA-TABS) 100 MG tablet Take 1 tablet (100 mg total) by mouth 2 (two) times daily. 14 tablet 0   No facility-administered medications prior to visit.      Per HPI unless specifically indicated in ROS section below Review of Systems     Objective:    BP 120/82 (BP Location: Left Arm, Patient Position: Sitting, Cuff Size: Large)   Pulse 95   Temp 98 F (36.7 C) (Oral)   Ht 5\' 7"  (1.702 m)   Wt 231 lb (104.8 kg)   SpO2 98%   BMI 36.18 kg/m     Wt Readings from Last 3 Encounters:  05/16/17 231 lb (104.8 kg)  11/21/16 221 lb 6.4 oz (100.4 kg)  10/13/16 215 lb 12 oz (97.9 kg)    Physical Exam  Constitutional: She appears well-developed and well-nourished. No distress.  HENT:  Head: Normocephalic and atraumatic.  Right Ear: Hearing, tympanic membrane and ear canal normal.  Left Ear: Hearing, tympanic membrane and ear canal normal.  Nose: Mucosal edema (nasal mucosal congestion R>L nares) present. No rhinorrhea.  Mouth/Throat: Uvula is midline, oropharynx is clear and moist and mucous membranes are normal. No oropharyngeal exudate, posterior oropharyngeal edema, posterior oropharyngeal erythema or tonsillar abscesses.  Eyes: Pupils are equal, round, and reactive to light. Conjunctivae and EOM are normal. No scleral icterus.  Neck: Normal range of motion. Neck supple. No thyromegaly present.  Lymphadenopathy:    She has no cervical adenopathy.  Skin: Skin is warm and dry. No rash noted.  Psychiatric: She has a normal mood and affect. Her behavior is normal.  Nursing note and vitals reviewed.   Depression screen [facility] 2/9 05/16/2017 10/13/2016  Decreased Interest 0 0  Down, Depressed, Hopeless 0 0  PHQ - 2 Score 0 0  Altered sleeping 1 -  Tired, decreased energy 1 -  Change in appetite 1 -  Feeling bad or failure about yourself  0 -  Trouble concentrating 0 -  Moving slowly or fidgety/restless 0 -  Suicidal thoughts 0 -  PHQ-9 Score 3 -   GAD 7 : Generalized Anxiety Score 05/16/2017  Nervous, Anxious, on Edge 2  Control/stop worrying 1  Worry too much - different things 1  Trouble relaxing 2  Restless 0  Easily annoyed or irritable 2  Afraid - awful might happen 1  Total GAD 7 Score 9       Assessment & Plan:   Problem List Items Addressed This Visit    GAD (generalized anxiety disorder) - Primary    Chronic, some deterioration so will increase celexa to 20mg  daily per pt request. GAD7, PHQ9 updated today.  PHQ9 =  3 GAD7 = 9      Relevant Medications   citalopram (CELEXA) 20 MG tablet   Seasonal allergic rhinitis due to pollen    Deteriorated this year.  Discussed non sedating antihistamine, intranasal steroid, and allergen avoidance. Rx flonase and claritin sent to pharmacy.  Update if not improving with treatment.           Meds ordered this encounter  Medications  . citalopram (CELEXA) 20 MG tablet    Sig: Take 1 tablet (20 mg total) by mouth daily.    Dispense:  90 tablet    Refill:  3  . loratadine (CLARITIN) 10 MG tablet    Sig: Take 1 tablet (10 mg total) by mouth daily.    Dispense:  30 tablet    Refill:  11  . fluticasone (FLONASE) 50 MCG/ACT nasal spray    Sig: Place 2 sprays into both nostrils daily.    Dispense:  16 g    Refill:  6   No orders of the defined types were placed in this encounter.   Follow up plan: No follow-ups on file.  Ria Bush, MD

## 2017-05-16 NOTE — Patient Instructions (Addendum)
Try claritin or allegra daily for allergies - if not controlling symptoms, then buy nasal steroid (flonase). Work on allergen avoidance - nasal saline irrigation throughout the day, and quick showers right after going indoors if outdoors for prolonged period of time.  Increase celexa to 20mg  daily - new dose sent to pharmacy.

## 2017-05-16 NOTE — Assessment & Plan Note (Addendum)
Chronic, some deterioration so will increase celexa to 20mg  daily per pt request. GAD7, PHQ9 updated today.  PHQ9 = 3 GAD7 = 9

## 2017-05-19 ENCOUNTER — Other Ambulatory Visit: Payer: Self-pay | Admitting: Family Medicine

## 2017-11-01 ENCOUNTER — Ambulatory Visit: Payer: BLUE CROSS/BLUE SHIELD | Admitting: Family Medicine

## 2017-11-01 ENCOUNTER — Encounter: Payer: Self-pay | Admitting: Family Medicine

## 2017-11-01 VITALS — BP 140/92 | HR 96 | Temp 98.3°F | Ht 67.0 in | Wt 239.5 lb

## 2017-11-01 DIAGNOSIS — R350 Frequency of micturition: Secondary | ICD-10-CM | POA: Diagnosis not present

## 2017-11-01 DIAGNOSIS — R1030 Lower abdominal pain, unspecified: Secondary | ICD-10-CM | POA: Diagnosis not present

## 2017-11-01 LAB — CBC WITH DIFFERENTIAL/PLATELET
BASOS PCT: 0.6 % (ref 0.0–3.0)
Basophils Absolute: 0 10*3/uL (ref 0.0–0.1)
EOS PCT: 0.9 % (ref 0.0–5.0)
Eosinophils Absolute: 0.1 10*3/uL (ref 0.0–0.7)
HEMATOCRIT: 40.9 % (ref 36.0–46.0)
HEMOGLOBIN: 13.8 g/dL (ref 12.0–15.0)
Lymphocytes Relative: 25.3 % (ref 12.0–46.0)
Lymphs Abs: 1.9 10*3/uL (ref 0.7–4.0)
MCHC: 33.7 g/dL (ref 30.0–36.0)
MCV: 89.5 fl (ref 78.0–100.0)
MONO ABS: 0.7 10*3/uL (ref 0.1–1.0)
MONOS PCT: 9.9 % (ref 3.0–12.0)
Neutro Abs: 4.7 10*3/uL (ref 1.4–7.7)
Neutrophils Relative %: 63.3 % (ref 43.0–77.0)
Platelets: 243 10*3/uL (ref 150.0–400.0)
RBC: 4.58 Mil/uL (ref 3.87–5.11)
RDW: 13.4 % (ref 11.5–15.5)
WBC: 7.4 10*3/uL (ref 4.0–10.5)

## 2017-11-01 LAB — POC URINALSYSI DIPSTICK (AUTOMATED)
BILIRUBIN UA: NEGATIVE
Blood, UA: NEGATIVE
Glucose, UA: NEGATIVE
KETONES UA: NEGATIVE
LEUKOCYTES UA: NEGATIVE
Nitrite, UA: NEGATIVE
PROTEIN UA: NEGATIVE
Spec Grav, UA: 1.015 (ref 1.010–1.025)
Urobilinogen, UA: 0.2 E.U./dL
pH, UA: 6 (ref 5.0–8.0)

## 2017-11-01 LAB — BASIC METABOLIC PANEL
BUN: 13 mg/dL (ref 6–23)
CHLORIDE: 101 meq/L (ref 96–112)
CO2: 28 mEq/L (ref 19–32)
Calcium: 9.4 mg/dL (ref 8.4–10.5)
Creatinine, Ser: 0.69 mg/dL (ref 0.40–1.20)
GFR: 101.57 mL/min (ref >60.00)
Glucose, Bld: 97 mg/dL (ref 70–99)
POTASSIUM: 4.4 meq/L (ref 3.5–5.1)
SODIUM: 137 meq/L (ref 135–145)

## 2017-11-01 LAB — LIPASE: Lipase: 22 U/L (ref 11.0–59.0)

## 2017-11-01 LAB — HEPATIC FUNCTION PANEL
ALK PHOS: 70 U/L (ref 39–117)
ALT: 33 U/L (ref 0–35)
AST: 27 U/L (ref 0–37)
Albumin: 4.1 g/dL (ref 3.5–5.2)
BILIRUBIN TOTAL: 0.5 mg/dL (ref 0.2–1.2)
Bilirubin, Direct: 0.1 mg/dL (ref 0.0–0.3)
TOTAL PROTEIN: 7.5 g/dL (ref 6.0–8.3)

## 2017-11-01 LAB — POCT URINE PREGNANCY: Preg Test, Ur: NEGATIVE

## 2017-11-01 NOTE — Progress Notes (Signed)
Dr. Frederico Hamman T. Tasman Zapata, MD, [facility] [place], [address] Phone: [phone] Fax: [phone]  11/01/2017  Patient: Jacqueline Vazquez, MRN: [id number], DOB: December 31, 1980, 37 y.o.  Primary Physician:  Ria Bush, MD   Chief Complaint  Patient presents with  . Urinary Frequency  . Abdominal Pain   Subjective:   Jacqueline Vazquez is a 37 y.o. very pleasant female patient who presents with the following:  A couple - 3 of days, urgency, pain after. Some lower abdominal pain.  Increased urination. No OTC meds Eating and drinking OK. BM's normal. Some diarrhea and she has IBS.  Extreme stress and anxiety right now.  No fevers No vomitting  Husband had a vasectomy.  No period in 5 years.   No discharge.  Monogamous with husband, no std concern.  Eating and drinking ok.   UPT is negative  Past Medical History, Surgical History, Social History, Family History, Problem List, Medications, and Allergies have been reviewed and updated if relevant.  Patient Active Problem List   Diagnosis Date Noted  . Seasonal allergic rhinitis due to pollen 05/16/2017  . Lipoma of arm 04/03/2016  . Right foot pain 01/25/2015  . Neck swelling 05/12/2013  . GAD (generalized anxiety disorder) 01/17/2013  . Irritable bowel syndrome     Past Medical History:  Diagnosis Date  . Gestational hypertension   . Heartburn in pregnancy   . History of depression college   resolved with 2 yrs zoloft  . History of kidney stones 2012  . Hx of migraines   . Irritable bowel syndrome   . Pregnancy complication    placenta accreta with hemorrhage with ICU stay, gest HTN    Past Surgical History:  Procedure Laterality Date  . CESAREAN SECTION  11/23/2011   Surgeon: Marylynn Pearson, MD; PRIMARY  . COLONOSCOPY  2008   IBS, int hemmorhoids, blood with BMs ([place])  . DILATION AND CURETTAGE OF UTERUS  2011   miscarriage  .  ESOPHAGOGASTRODUODENOSCOPY  2008   [facility])  . WISDOM TOOTH EXTRACTION      Social History   Socioeconomic History  . Marital status: Married    Spouse name: Not on file  . Number of children: Not on file  . Years of education: Not on file  . Highest education level: Not on file  Occupational History  . Not on file  Social Needs  . Financial resource strain: Not on file  . Food insecurity:    Worry: Not on file    Inability: Not on file  . Transportation needs:    Medical: Not on file    Non-medical: Not on file  Tobacco Use  . Smoking status: Former Smoker    Last attempt to quit: 12/15/2012    Years since quitting: 4.8  . Smokeless tobacco: Never Used  Substance and Sexual Activity  . Alcohol use: No    Alcohol/week: 0.0 standard drinks  . Drug use: No  . Sexual activity: Yes    Birth control/protection: None    Comment: quit smoking with preg  Lifestyle  . Physical activity:    Days per week: Not on file    Minutes per session: Not on file  . Stress: Not on file  Relationships  . Social connections:    Talks on phone: Not on file    Gets together: Not on file    Attends religious service: Not on file  Active member of club or organization: Not on file    Attends meetings of clubs or organizations: Not on file    Relationship status: Not on file  . Intimate partner violence:    Fear of current or ex partner: Not on file    Emotionally abused: Not on file    Physically abused: Not on file    Forced sexual activity: Not on file  Other Topics Concern  . Not on file  Social History Narrative   Lives with husband and daughter, 1 dog   Occupation: Location manager   Edu: college   Activity: no regular exercise   Diet:    A+ blood type    Family History  Problem Relation Age of Onset  . Hypertension Father   . Diabetes Father   . Diabetes Maternal Grandmother   . Hyperlipidemia Father   . Depression Mother        with anxiety  . Cancer  Maternal Grandfather        esophageal and lung (smoker)  . Heart disease Father        unsure details  . Cancer Sister        ovarian/cervical  . Stroke Neg Hx     Allergies  Allergen Reactions  . Percocet [Oxycodone-Acetaminophen] Nausea And Vomiting  . Flagyl [Metronidazole Hcl] Rash    Medication list reviewed and updated in full in [facility].  ROS: GEN: Acute illness details above GI: Tolerating PO intake GU: maintaining adequate hydration and urination Pulm: No SOB Interactive and getting along well at home.  Otherwise, ROS is as per the HPI.  Objective:   BP (!) 140/92   Pulse 96   Temp 98.3 F (36.8 C) (Oral)   Ht 5\' 7"  (1.702 m)   Wt 239 lb 8 oz (108.6 kg)   BMI 37.51 kg/m   GEN: WDWN, NAD, Non-toxic, A & O x 3 HEENT: Atraumatic, Normocephalic. Neck supple. No masses, No LAD. Ears and Nose: No external deformity. CV: RRR, No M/G/R. No JVD. No thrill. No extra heart sounds. PULM: CTA B, no wheezes, crackles, rhonchi. No retractions. No resp. distress. No accessory muscle use. ABD: S, mild lower quadrant abd pain, ND, +BS. No rebound. No HSM. EXTR: No c/c/e NEURO Normal gait.  PSYCH: Normally interactive. Conversant. Not depressed or anxious appearing.  Calm demeanor.     Laboratory and Imaging Data: Results for orders placed or performed in visit on 42/70/62  Basic metabolic panel  Result Value Ref Range   Sodium 137 135 - 145 mEq/L   Potassium 4.4 3.5 - 5.1 mEq/L   Chloride 101 96 - 112 mEq/L   CO2 28 19 - 32 mEq/L   Glucose, Bld 97 70 - 99 mg/dL   BUN 13 6 - 23 mg/dL   Creatinine, Ser 0.69 0.40 - 1.20 mg/dL   Calcium 9.4 8.4 - 10.5 mg/dL   GFR 101.57 >60.00 mL/min  CBC with Differential/Platelet  Result Value Ref Range   WBC 7.4 4.0 - 10.5 K/uL   RBC 4.58 3.87 - 5.11 Mil/uL   Hemoglobin 13.8 12.0 - 15.0 g/dL   HCT 40.9 36.0 - 46.0 %   MCV 89.5 78.0 - 100.0 fl   MCHC 33.7 30.0 - 36.0 g/dL   RDW 13.4 11.5 - 15.5 %   Platelets 243.0  150.0 - 400.0 K/uL   Neutrophils Relative % 63.3 43.0 - 77.0 %   Lymphocytes Relative 25.3 12.0 - 46.0 %  Monocytes Relative 9.9 3.0 - 12.0 %   Eosinophils Relative 0.9 0.0 - 5.0 %   Basophils Relative 0.6 0.0 - 3.0 %   Neutro Abs 4.7 1.4 - 7.7 K/uL   Lymphs Abs 1.9 0.7 - 4.0 K/uL   Monocytes Absolute 0.7 0.1 - 1.0 K/uL   Eosinophils Absolute 0.1 0.0 - 0.7 K/uL   Basophils Absolute 0.0 0.0 - 0.1 K/uL  Hepatic function panel  Result Value Ref Range   Total Bilirubin 0.5 0.2 - 1.2 mg/dL   Bilirubin, Direct 0.1 0.0 - 0.3 mg/dL   Alkaline Phosphatase 70 39 - 117 U/L   AST 27 0 - 37 U/L   ALT 33 0 - 35 U/L   Total Protein 7.5 6.0 - 8.3 g/dL   Albumin 4.1 3.5 - 5.2 g/dL  Lipase  Result Value Ref Range   Lipase 22.0 11.0 - 59.0 U/L  POCT Urinalysis Dipstick (Automated)  Result Value Ref Range   Color, UA Yellow    Clarity, UA Clear    Glucose, UA Negative Negative   Bilirubin, UA Negative    Ketones, UA Negative    Spec Grav, UA 1.015 1.010 - 1.025   Blood, UA Negative    pH, UA 6.0 5.0 - 8.0   Protein, UA Negative Negative   Urobilinogen, UA 0.2 0.2 or 1.0 E.U./dL   Nitrite, UA Negative    Leukocytes, UA Negative Negative  POCT urine pregnancy  Result Value Ref Range   Preg Test, Ur Negative Negative     Assessment and Plan:   Urinary frequency - Plan: POCT Urinalysis Dipstick (Automated), Urine Culture  Lower abdominal pain - Plan: POCT urine pregnancy, Basic metabolic panel, CBC with Differential/Platelet, Hepatic function panel, Lipase  Labs have all come back, and they are completely benign.  Urine culture is still pending, and given the clinical history this would be the likeliest source of issues.  The patient also does have a small uterine fibroid, and this could be a reasonable cause also.  Follow-up: No follow-ups on file.  Orders Placed This Encounter  Procedures  . Urine Culture  . Basic metabolic panel  . CBC with Differential/Platelet  . Hepatic  function panel  . Lipase  . POCT Urinalysis Dipstick (Automated)  . POCT urine pregnancy    Signed,  Zohal Reny T. Kathleen Likins, MD   Allergies as of 11/01/2017      Reactions   Percocet [oxycodone-acetaminophen] Nausea And Vomiting   Flagyl [metronidazole Hcl] Rash      Medication List        Accurate as of 11/01/17 11:59 PM. Always use your most recent med list.          citalopram 20 MG tablet Commonly known as:  CELEXA Take 1 tablet (20 mg total) by mouth daily.   loratadine 10 MG tablet Commonly known as:  CLARITIN Take 1 tablet (10 mg total) by mouth daily.

## 2017-11-02 ENCOUNTER — Encounter: Payer: Self-pay | Admitting: Family Medicine

## 2017-11-02 LAB — URINE CULTURE
MICRO NUMBER: 91158755
SPECIMEN QUALITY: ADEQUATE

## 2018-04-24 ENCOUNTER — Other Ambulatory Visit: Payer: Self-pay | Admitting: Family Medicine

## 2018-05-17 ENCOUNTER — Other Ambulatory Visit: Payer: Self-pay | Admitting: Family Medicine

## 2018-06-08 ENCOUNTER — Other Ambulatory Visit: Payer: Self-pay | Admitting: Family Medicine

## 2018-06-12 ENCOUNTER — Ambulatory Visit: Payer: BLUE CROSS/BLUE SHIELD | Admitting: Family Medicine

## 2018-11-10 ENCOUNTER — Other Ambulatory Visit: Payer: Self-pay | Admitting: Family Medicine

## 2018-12-11 ENCOUNTER — Ambulatory Visit: Payer: BC Managed Care – PPO | Admitting: Family Medicine

## 2018-12-16 ENCOUNTER — Encounter: Payer: Self-pay | Admitting: Family Medicine

## 2018-12-16 ENCOUNTER — Ambulatory Visit (INDEPENDENT_AMBULATORY_CARE_PROVIDER_SITE_OTHER): Payer: BC Managed Care – PPO | Admitting: Family Medicine

## 2018-12-16 ENCOUNTER — Other Ambulatory Visit: Payer: Self-pay

## 2018-12-16 VITALS — BP 124/96 | HR 101 | Temp 97.9°F | Ht 67.0 in | Wt 282.5 lb

## 2018-12-16 DIAGNOSIS — Z23 Encounter for immunization: Secondary | ICD-10-CM | POA: Diagnosis not present

## 2018-12-16 DIAGNOSIS — F411 Generalized anxiety disorder: Secondary | ICD-10-CM

## 2018-12-16 MED ORDER — CITALOPRAM HYDROBROMIDE 40 MG PO TABS: 40.0000 mg | ORAL_TABLET | Freq: Every day | ORAL | 3 refills | Status: DC

## 2018-12-16 MED ORDER — LORAZEPAM 0.5 MG PO TABS: 0.2500 mg | ORAL_TABLET | Freq: Two times a day (BID) | ORAL | 1 refills | Status: DC | PRN

## 2018-12-16 NOTE — Progress Notes (Signed)
This visit was conducted in person.  BP (!) 124/96 (BP Location: Right Arm, Patient Position: Sitting, Cuff Size: Large)   Pulse (!) 101   Temp 97.9 F (36.6 C) (Temporal)   Ht 5\' 7"  (1.702 m)   Wt 282 lb 8 oz (128.1 kg)   LMP 11/29/2018   SpO2 98%   BMI 44.25 kg/m   BP Readings from Last 3 Encounters:  12/16/18 (!) 124/96  11/01/17 (!) 140/92  05/16/17 120/82  On repeat, 134/100  CC: anxiety  Subjective:    Patient ID: Jacqueline Vazquez, female    DOB: 11/03/1980, 39 y.o.   MRN: [id number]  HPI: Jacqueline Vazquez is a 37 y.o. female presenting on 12/16/2018 for Anxiety (Here for f/u.  Pt really stressed.  Wants to discuss increasing med. Has been taking Celexa 2 tabs daily about 1 wk. )   I last saw patient 05/2017. At that time we increased celexa to 20mg  due to worsening anxiety. See prior note for details.   Father currently in hospital on life support with CHF, mother with her own health problems living with her now. Sister and her family also staying with her right now. Husband is good support for her too.      Relevant past medical, surgical, family and social history reviewed and updated as indicated. Interim medical history since our last visit reviewed. Allergies and medications reviewed and updated. Outpatient Medications Prior to Visit  Medication Sig Dispense Refill  . loratadine (CLARITIN) 10 MG tablet TAKE 1 TABLET BY MOUTH EVERY DAY 30 tablet 11  . citalopram (CELEXA) 20 MG tablet TAKE 1 TABLET BY MOUTH EVERY DAY (Patient taking differently: Takes 2 tablets daily) 30 tablet 5   No facility-administered medications prior to visit.      Per HPI unless specifically indicated in ROS section below Review of Systems Objective:    BP (!) 124/96 (BP Location: Right Arm, Patient Position: Sitting, Cuff Size: Large)   Pulse (!) 101   Temp 97.9 F (36.6 C) (Temporal)   Ht 5\' 7"  (1.702 m)   Wt 282 lb 8 oz (128.1 kg)   LMP 11/29/2018   SpO2 98%   BMI 44.25  kg/m   Wt Readings from Last 3 Encounters:  12/16/18 282 lb 8 oz (128.1 kg)  11/01/17 239 lb 8 oz (108.6 kg)  05/16/17 231 lb (104.8 kg)    Physical Exam Vitals signs and nursing note reviewed.  Constitutional:      General: She is not in acute distress.    Appearance: Normal appearance. She is obese. She is not ill-appearing.  Cardiovascular:     Rate and Rhythm: Normal rate and regular rhythm.     Pulses: Normal pulses.     Heart sounds: Normal heart sounds. No murmur.  Pulmonary:     Effort: Pulmonary effort is normal. No respiratory distress.     Breath sounds: Normal breath sounds. No wheezing, rhonchi or rales.  Neurological:     Mental Status: She is alert.  Psychiatric:        Mood and Affect: Mood normal.        Behavior: Behavior normal.     Comments: Tearful with discussion of stressors       Depression screen [facility] 2/9 12/16/2018 05/16/2017 10/13/2016  Decreased Interest 1 0 0  Down, Depressed, Hopeless 1 0 0  PHQ - 2 Score 2 0 0  Altered sleeping 1 1 -  Tired, decreased energy 1 1 -  Change in appetite 1 1 -  Feeling bad or failure about yourself  0 0 -  Trouble concentrating 0 0 -  Moving slowly or fidgety/restless 0 0 -  Suicidal thoughts 0 0 -  PHQ-9 Score 5 3 -   GAD 7 : Generalized Anxiety Score 12/16/2018 05/16/2017  Nervous, Anxious, on Edge 2 2  Control/stop worrying 2 1  Worry too much - different things 2 1  Trouble relaxing 2 2  Restless 2 0  Easily annoyed or irritable 0 2  Afraid - awful might happen 2 1  Total GAD 7 Score 12 9    Assessment & Plan:   Problem List Items Addressed This Visit    GAD (generalized anxiety disorder) - Primary    Acute worsening of GAD consistent with adjustment disorder to recent stressor of father's critical illness over the past week. She is struggling with caring for father and rest of her family. Agree with temporary increase in celexa to 40mg  daily - reviewing side effects to watch for - as well as temporary  ativan Rx. Reviewed benzo Rx - controlled substance that can be habit forming with addiction potential as well as reviewing dependence/tolerance potential of medication. Reviewed how to take medication, update with effect.       Relevant Medications   citalopram (CELEXA) 40 MG tablet   LORazepam (ATIVAN) 0.5 MG tablet    Other Visit Diagnoses    Need for influenza vaccination       Relevant Orders   Flu Vaccine QUAD 36+ mos IM (Completed)       Meds ordered this encounter  Medications  . citalopram (CELEXA) 40 MG tablet    Sig: Take 1 tablet (40 mg total) by mouth daily.    Dispense:  30 tablet    Refill:  3  . LORazepam (ATIVAN) 0.5 MG tablet    Sig: Take 0.5-1 tablets (0.25-0.5 mg total) by mouth 2 (two) times daily as needed for anxiety.    Dispense:  30 tablet    Refill:  1   Orders Placed This Encounter  Procedures  . Flu Vaccine QUAD 36+ mos IM    Follow up plan: Return if symptoms worsen or fail to improve.  Ria Bush, MD

## 2018-12-16 NOTE — Patient Instructions (Addendum)
I'm sorry to hear about your dad! Increase celexa to 40mg  - new dose at pharmacy.  Try lorazepam (ativan) 1/2-1 tablet twice daily as needed for stress/anxiety.  Let me know how you do with this.  Blood pressure is a bit elevated - start monitoring at home. Let me know if consistently >140/90.

## 2018-12-16 NOTE — Assessment & Plan Note (Signed)
Acute worsening of GAD consistent with adjustment disorder to recent stressor of father's critical illness over the past week. She is struggling with caring for father and rest of her family. Agree with temporary increase in celexa to 40mg  daily - reviewing side effects to watch for - as well as temporary ativan Rx. Reviewed benzo Rx - controlled substance that can be habit forming with addiction potential as well as reviewing dependence/tolerance potential of medication. Reviewed how to take medication, update with effect.

## 2019-02-05 DIAGNOSIS — Z6836 Body mass index (BMI) 36.0-36.9, adult: Secondary | ICD-10-CM | POA: Diagnosis not present

## 2019-02-05 DIAGNOSIS — N760 Acute vaginitis: Secondary | ICD-10-CM | POA: Diagnosis not present

## 2019-02-05 DIAGNOSIS — Z01419 Encounter for gynecological examination (general) (routine) without abnormal findings: Secondary | ICD-10-CM | POA: Diagnosis not present

## 2019-03-19 ENCOUNTER — Other Ambulatory Visit: Payer: Self-pay | Admitting: Family Medicine

## 2019-03-20 NOTE — Telephone Encounter (Signed)
ERx 

## 2019-03-20 NOTE — Telephone Encounter (Signed)
Name of Medication: Lorazepam Name of Pharmacy: [facility] Last Fill or Written Date and Quantity: 01/25/19, #30 Last Office Visit and Type: 12/16/18, GAD f/u Next Office Visit and Type: none Last Controlled Substance Agreement Date: none Last UDS: none

## 2019-04-15 ENCOUNTER — Other Ambulatory Visit: Payer: Self-pay | Admitting: Family Medicine

## 2019-04-23 ENCOUNTER — Telehealth: Payer: Self-pay | Admitting: Family Medicine

## 2019-04-23 NOTE — Telephone Encounter (Signed)
Noted  

## 2019-04-23 NOTE — Telephone Encounter (Signed)
Called and got patient scheduled for cpe and labs. 

## 2019-04-23 NOTE — Telephone Encounter (Signed)
E-scribed refill.  Plz schedule CPE and lab visits. 

## 2019-05-06 ENCOUNTER — Other Ambulatory Visit (INDEPENDENT_AMBULATORY_CARE_PROVIDER_SITE_OTHER): Payer: BC Managed Care – PPO

## 2019-05-06 ENCOUNTER — Other Ambulatory Visit: Payer: Self-pay | Admitting: Family Medicine

## 2019-05-06 ENCOUNTER — Other Ambulatory Visit: Payer: Self-pay

## 2019-05-06 DIAGNOSIS — Z13220 Encounter for screening for lipoid disorders: Secondary | ICD-10-CM | POA: Diagnosis not present

## 2019-05-06 DIAGNOSIS — Z6841 Body Mass Index (BMI) 40.0 and over, adult: Secondary | ICD-10-CM | POA: Diagnosis not present

## 2019-05-06 DIAGNOSIS — F411 Generalized anxiety disorder: Secondary | ICD-10-CM | POA: Diagnosis not present

## 2019-05-06 DIAGNOSIS — Z131 Encounter for screening for diabetes mellitus: Secondary | ICD-10-CM | POA: Diagnosis not present

## 2019-05-06 LAB — BASIC METABOLIC PANEL
BUN: 17 mg/dL (ref 6–23)
CO2: 27 mEq/L (ref 19–32)
Calcium: 8.8 mg/dL (ref 8.4–10.5)
Chloride: 103 mEq/L (ref 96–112)
Creatinine, Ser: 0.76 mg/dL (ref 0.40–1.20)
GFR: 84.79 mL/min (ref >60.00)
Glucose, Bld: 102 mg/dL — ABNORMAL HIGH (ref 70–99)
Potassium: 3.7 mEq/L (ref 3.5–5.1)
Sodium: 137 mEq/L (ref 135–145)

## 2019-05-06 LAB — CBC WITH DIFFERENTIAL/PLATELET
Basophils Absolute: 0.1 10*3/uL (ref 0.0–0.1)
Basophils Relative: 0.9 % (ref 0.0–3.0)
Eosinophils Absolute: 0.1 10*3/uL (ref 0.0–0.7)
Eosinophils Relative: 2.7 % (ref 0.0–5.0)
HCT: 40.8 % (ref 36.0–46.0)
Hemoglobin: 13.5 g/dL (ref 12.0–15.0)
Lymphocytes Relative: 26.6 % (ref 12.0–46.0)
Lymphs Abs: 1.5 10*3/uL (ref 0.7–4.0)
MCHC: 33.1 g/dL (ref 30.0–36.0)
MCV: 90.8 fl (ref 78.0–100.0)
Monocytes Absolute: 0.5 10*3/uL (ref 0.1–1.0)
Monocytes Relative: 9.4 % (ref 3.0–12.0)
Neutro Abs: 3.3 10*3/uL (ref 1.4–7.7)
Neutrophils Relative %: 60.4 % (ref 43.0–77.0)
Platelets: 236 10*3/uL (ref 150.0–400.0)
RBC: 4.49 Mil/uL (ref 3.87–5.11)
RDW: 13.9 % (ref 11.5–15.5)
WBC: 5.5 10*3/uL (ref 4.0–10.5)

## 2019-05-06 LAB — HEPATIC FUNCTION PANEL
ALT: 12 U/L (ref 0–35)
AST: 13 U/L (ref 0–37)
Albumin: 4.1 g/dL (ref 3.5–5.2)
Alkaline Phosphatase: 62 U/L (ref 39–117)
Bilirubin, Direct: 0.1 mg/dL (ref 0.0–0.3)
Total Bilirubin: 0.6 mg/dL (ref 0.2–1.2)
Total Protein: 7.1 g/dL (ref 6.0–8.3)

## 2019-05-06 LAB — LIPID PANEL
Cholesterol: 167 mg/dL (ref 0–200)
HDL: 52 mg/dL (ref >39.00)
LDL Cholesterol: 105 mg/dL — ABNORMAL HIGH (ref 0–99)
NonHDL: 114.81
Total CHOL/HDL Ratio: 3
Triglycerides: 47 mg/dL (ref 0.0–149.0)
VLDL: 9.4 mg/dL (ref 0.0–40.0)

## 2019-05-06 LAB — TSH: TSH: 2.35 u[IU]/mL (ref 0.35–4.50)

## 2019-05-06 NOTE — Addendum Note (Signed)
Addended by: Ria Bush on: 05/06/2019 07:33 AM   Modules accepted: Orders

## 2019-05-13 ENCOUNTER — Ambulatory Visit (INDEPENDENT_AMBULATORY_CARE_PROVIDER_SITE_OTHER): Payer: BC Managed Care – PPO | Admitting: Family Medicine

## 2019-05-13 ENCOUNTER — Encounter: Payer: Self-pay | Admitting: Family Medicine

## 2019-05-13 ENCOUNTER — Other Ambulatory Visit: Payer: Self-pay

## 2019-05-13 VITALS — BP 122/88 | HR 84 | Temp 97.7°F | Ht 66.5 in | Wt 235.5 lb

## 2019-05-13 DIAGNOSIS — R739 Hyperglycemia, unspecified: Secondary | ICD-10-CM | POA: Diagnosis not present

## 2019-05-13 DIAGNOSIS — Z0001 Encounter for general adult medical examination with abnormal findings: Secondary | ICD-10-CM | POA: Insufficient documentation

## 2019-05-13 DIAGNOSIS — E669 Obesity, unspecified: Secondary | ICD-10-CM | POA: Diagnosis not present

## 2019-05-13 DIAGNOSIS — F411 Generalized anxiety disorder: Secondary | ICD-10-CM

## 2019-05-13 DIAGNOSIS — Z0000 Encounter for general adult medical examination without abnormal findings: Secondary | ICD-10-CM | POA: Insufficient documentation

## 2019-05-13 DIAGNOSIS — E66812 Obesity, class 2: Secondary | ICD-10-CM | POA: Insufficient documentation

## 2019-05-13 MED ORDER — CITALOPRAM HYDROBROMIDE 40 MG PO TABS: 40.0000 mg | ORAL_TABLET | Freq: Every day | ORAL | 3 refills | Status: DC

## 2019-05-13 NOTE — Patient Instructions (Signed)
You are doing well today. Work on regular exercise routine.  Return in 1 year for mood check, fasting.  Return in 2 years for next physical.   Health Maintenance, Female Adopting a healthy lifestyle and getting preventive care are important in promoting health and wellness. Ask your health care provider about:  The right schedule for you to have regular tests and exams.  Things you can do on your own to prevent diseases and keep yourself healthy. What should I know about diet, weight, and exercise? Eat a healthy diet   Eat a diet that includes plenty of vegetables, fruits, low-fat dairy products, and lean protein.  Do not eat a lot of foods that are high in solid fats, added sugars, or sodium. Maintain a healthy weight Body mass index (BMI) is used to identify weight problems. It estimates body fat based on height and weight. Your health care provider can help determine your BMI and help you achieve or maintain a healthy weight. Get regular exercise Get regular exercise. This is one of the most important things you can do for your health. Most adults should:  Exercise for at least 150 minutes each week. The exercise should increase your heart rate and make you sweat (moderate-intensity exercise).  Do strengthening exercises at least twice a week. This is in addition to the moderate-intensity exercise.  Spend less time sitting. Even light physical activity can be beneficial. Watch cholesterol and blood lipids Have your blood tested for lipids and cholesterol at 39 years of age, then have this test every 5 years. Have your cholesterol levels checked more often if:  Your lipid or cholesterol levels are high.  You are older than 39 years of age.  You are at high risk for heart disease. What should I know about cancer screening? Depending on your health history and family history, you may need to have cancer screening at various ages. This may include screening for:  Breast  cancer.  Cervical cancer.  Colorectal cancer.  Skin cancer.  Lung cancer. What should I know about heart disease, diabetes, and high blood pressure? Blood pressure and heart disease  High blood pressure causes heart disease and increases the risk of stroke. This is more likely to develop in people who have high blood pressure readings, are of African descent, or are overweight.  Have your blood pressure checked: ? Every 3-5 years if you are 59-43 years of age. ? Every year if you are 70 years old or older. Diabetes Have regular diabetes screenings. This checks your fasting blood sugar level. Have the screening done:  Once every three years after age 28 if you are at a normal weight and have a low risk for diabetes.  More often and at a younger age if you are overweight or have a high risk for diabetes. What should I know about preventing infection? Hepatitis B If you have a higher risk for hepatitis B, you should be screened for this virus. Talk with your health care provider to find out if you are at risk for hepatitis B infection. Hepatitis C Testing is recommended for:  Everyone born from [birth year] through 1965.  Anyone with known risk factors for hepatitis C. Sexually transmitted infections (STIs)  Get screened for STIs, including gonorrhea and chlamydia, if: ? You are sexually active and are younger than 39 years of age. ? You are older than 39 years of age and your health care provider tells you that you are at risk for this type  of infection. ? Your sexual activity has changed since you were last screened, and you are at increased risk for chlamydia or gonorrhea. Ask your health care provider if you are at risk.  Ask your health care provider about whether you are at high risk for HIV. Your health care provider may recommend a prescription medicine to help prevent HIV infection. If you choose to take medicine to prevent HIV, you should first get tested for HIV. You should  then be tested every 3 months for as long as you are taking the medicine. Pregnancy  If you are about to stop having your period (premenopausal) and you may become pregnant, seek counseling before you get pregnant.  Take 400 to 800 micrograms (mcg) of folic acid every day if you become pregnant.  Ask for birth control (contraception) if you want to prevent pregnancy. Osteoporosis and menopause Osteoporosis is a disease in which the bones lose minerals and strength with aging. This can result in bone fractures. If you are 59 years old or older, or if you are at risk for osteoporosis and fractures, ask your health care provider if you should:  Be screened for bone loss.  Take a calcium or vitamin D supplement to lower your risk of fractures.  Be given hormone replacement therapy (HRT) to treat symptoms of menopause. Follow these instructions at home: Lifestyle  Do not use any products that contain nicotine or tobacco, such as cigarettes, e-cigarettes, and chewing tobacco. If you need help quitting, ask your health care provider.  Do not use street drugs.  Do not share needles.  Ask your health care provider for help if you need support or information about quitting drugs. Alcohol use  Do not drink alcohol if: ? Your health care provider tells you not to drink. ? You are pregnant, may be pregnant, or are planning to become pregnant.  If you drink alcohol: ? Limit how much you use to 0-1 drink a day. ? Limit intake if you are breastfeeding.  Be aware of how much alcohol is in your drink. In the [place] one drink equals one 12 oz bottle of beer (355 mL), one 5 oz glass of wine (148 mL), or one 1 oz glass of hard liquor (44 mL). General instructions  Schedule regular health, dental, and eye exams.  Stay current with your vaccines.  Tell your health care provider if: ? You often feel depressed. ? You have ever been abused or do not feel safe at home. Summary  Adopting a  healthy lifestyle and getting preventive care are important in promoting health and wellness.  Follow your health care provider's instructions about healthy diet, exercising, and getting tested or screened for diseases.  Follow your health care provider's instructions on monitoring your cholesterol and blood pressure. This information is not intended to replace advice given to you by your health care provider. Make sure you discuss any questions you have with your health care provider. Document Revised: 01/16/2018 Document Reviewed: 01/16/2018 Elsevier Patient Education  2020 [employer].

## 2019-05-13 NOTE — Progress Notes (Signed)
This visit was conducted in person.  BP 122/88 (BP Location: Left Arm, Patient Position: Sitting, Cuff Size: Large)   Pulse 84   Temp 97.7 F (36.5 C) (Temporal)   Ht 5' 6.5" (1.689 m)   Wt 235 lb 8 oz (106.8 kg)   LMP 05/10/2019   SpO2 99%   BMI 37.44 kg/m    CC: CPE  Subjective:    Patient ID: Jacqueline Vazquez, female    DOB: April 06, 1980, 39 y.o.   MRN: [id number]  HPI: GISEL DEWINTER is a 39 y.o. female presenting on 05/13/2019 for Annual Exam   Father passed away Feb 25, 2019. Mother moved in with her. This has been an adjustment. Feels doing well on celexa 40mg  daily and lorazepam PRN.  GERD managed well with daily esomeprazole 20mg  OTC.   Preventative: Well woman exam - with OBGYN Dr Orvan Seen at [facility] 01/2019.  Flu shot yearly Tdap 2013 COVID vaccine - planning to schedule today.  Seat belt use discussed  Sunscreen use discussed. No changing moles on skin.  Ex smoker - quit 2014, previously 12 PY hx.  Alcohol - 1-2 glasses of wine a few times a week Dentist q6 mo Eye exam yearly  Lives with husband and daughter, 1 dog  Occupation: Location manager  Edu: college  Activity: some walking with daughter Diet: diet pepsi, fruits/vegetables daily     Relevant past medical, surgical, family and social history reviewed and updated as indicated. Interim medical history since our last visit reviewed. Allergies and medications reviewed and updated. Outpatient Medications Prior to Visit  Medication Sig Dispense Refill  . esomeprazole (NEXIUM) 20 MG capsule Take 20 mg by mouth daily at 12 noon. OTC    . loratadine (CLARITIN) 10 MG tablet TAKE 1 TABLET BY MOUTH EVERY DAY 90 tablet 3  . LORazepam (ATIVAN) 0.5 MG tablet TAKE 0.5-1 TABLETS (0.25-0.5 MG TOTAL) BY MOUTH 2 (TWO) TIMES DAILY AS NEEDED FOR ANXIETY. 30 tablet 1  . citalopram (CELEXA) 40 MG tablet TAKE 1 TABLET BY MOUTH EVERY DAY 30 tablet 0   No facility-administered medications prior to  visit.     Per HPI unless specifically indicated in ROS section below Review of Systems  Constitutional: Negative for activity change, appetite change, chills, fatigue, fever and unexpected weight change.  HENT: Negative for hearing loss.   Eyes: Negative for visual disturbance.  Respiratory: Negative for cough, chest tightness, shortness of breath and wheezing.   Cardiovascular: Negative for chest pain, palpitations and leg swelling.  Gastrointestinal: Negative for abdominal distention, abdominal pain, blood in stool, constipation, diarrhea, nausea and vomiting.  Genitourinary: Negative for difficulty urinating and hematuria.  Musculoskeletal: Negative for arthralgias, myalgias and neck pain.  Skin: Negative for rash.  Neurological: Negative for dizziness, seizures, syncope and headaches.  Hematological: Negative for adenopathy. Does not bruise/bleed easily.  Psychiatric/Behavioral: Negative for dysphoric mood. The patient is nervous/anxious.    Objective:    BP 122/88 (BP Location: Left Arm, Patient Position: Sitting, Cuff Size: Large)   Pulse 84   Temp 97.7 F (36.5 C) (Temporal)   Ht 5' 6.5" (1.689 m)   Wt 235 lb 8 oz (106.8 kg)   LMP 05/10/2019   SpO2 99%   BMI 37.44 kg/m   Wt Readings from Last 3 Encounters:  05/13/19 235 lb 8 oz (106.8 kg)  12/16/18 282 lb 8 oz (128.1 kg)  11/01/17 239 lb 8 oz (108.6 kg)    Physical Exam Vitals and nursing note  reviewed.  Constitutional:      General: She is not in acute distress.    Appearance: Normal appearance. She is well-developed. She is obese. She is not ill-appearing.  HENT:     Head: Normocephalic and atraumatic.     Right Ear: Hearing, tympanic membrane, ear canal and external ear normal.     Left Ear: Hearing, tympanic membrane, ear canal and external ear normal.  Eyes:     General: No scleral icterus.    Extraocular Movements: Extraocular movements intact.     Conjunctiva/sclera: Conjunctivae normal.     Pupils:  Pupils are equal, round, and reactive to light.  Neck:     Thyroid: No thyromegaly or thyroid tenderness.  Cardiovascular:     Rate and Rhythm: Normal rate and regular rhythm.     Pulses: Normal pulses.          Radial pulses are 2+ on the right side and 2+ on the left side.     Heart sounds: Normal heart sounds. No murmur.  Pulmonary:     Effort: Pulmonary effort is normal. No respiratory distress.     Breath sounds: Normal breath sounds. No wheezing, rhonchi or rales.  Abdominal:     General: Abdomen is flat. Bowel sounds are normal. There is no distension.     Palpations: Abdomen is soft. There is no mass.     Tenderness: There is no abdominal tenderness. There is no guarding or rebound.     Hernia: No hernia is present.  Musculoskeletal:        General: Normal range of motion.     Cervical back: Normal range of motion and neck supple.     Right lower leg: No edema.     Left lower leg: No edema.  Lymphadenopathy:     Cervical: No cervical adenopathy.  Skin:    General: Skin is warm and dry.     Findings: No rash.  Neurological:     General: No focal deficit present.     Mental Status: She is alert and oriented to person, place, and time.     Comments: CN grossly intact, station and gait intact  Psychiatric:        Mood and Affect: Mood normal.        Behavior: Behavior normal.        Thought Content: Thought content normal.        Judgment: Judgment normal.       Results for orders placed or performed in visit on 05/06/19  Hepatic function panel  Result Value Ref Range   Total Bilirubin 0.6 0.2 - 1.2 mg/dL   Bilirubin, Direct 0.1 0.0 - 0.3 mg/dL   Alkaline Phosphatase 62 39 - 117 U/L   AST 13 0 - 37 U/L   ALT 12 0 - 35 U/L   Total Protein 7.1 6.0 - 8.3 g/dL   Albumin 4.1 3.5 - 5.2 g/dL  CBC with Differential/Platelet  Result Value Ref Range   WBC 5.5 4.0 - 10.5 K/uL   RBC 4.49 3.87 - 5.11 Mil/uL   Hemoglobin 13.5 12.0 - 15.0 g/dL   HCT 40.8 36.0 - 46.0 %   MCV  90.8 78.0 - 100.0 fl   MCHC 33.1 30.0 - 36.0 g/dL   RDW 13.9 11.5 - 15.5 %   Platelets 236.0 150.0 - 400.0 K/uL   Neutrophils Relative % 60.4 43.0 - 77.0 %   Lymphocytes Relative 26.6 12.0 - 46.0 %   Monocytes Relative  9.4 3.0 - 12.0 %   Eosinophils Relative 2.7 0.0 - 5.0 %   Basophils Relative 0.9 0.0 - 3.0 %   Neutro Abs 3.3 1.4 - 7.7 K/uL   Lymphs Abs 1.5 0.7 - 4.0 K/uL   Monocytes Absolute 0.5 0.1 - 1.0 K/uL   Eosinophils Absolute 0.1 0.0 - 0.7 K/uL   Basophils Absolute 0.1 0.0 - 0.1 K/uL  TSH  Result Value Ref Range   TSH 2.35 0.35 - 4.50 uIU/mL  Basic metabolic panel  Result Value Ref Range   Sodium 137 135 - 145 mEq/L   Potassium 3.7 3.5 - 5.1 mEq/L   Chloride 103 96 - 112 mEq/L   CO2 27 19 - 32 mEq/L   Glucose, Bld 102 (H) 70 - 99 mg/dL   BUN 17 6 - 23 mg/dL   Creatinine, Ser 0.76 0.40 - 1.20 mg/dL   GFR 84.79 >60.00 mL/min   Calcium 8.8 8.4 - 10.5 mg/dL  Lipid panel  Result Value Ref Range   Cholesterol 167 0 - 200 mg/dL   Triglycerides 47.0 0.0 - 149.0 mg/dL   HDL 52.00 >39.00 mg/dL   VLDL 9.4 0.0 - 40.0 mg/dL   LDL Cholesterol 105 (H) 0 - 99 mg/dL   Total CHOL/HDL Ratio 3    NonHDL 114.81    Depression screen [facility] 2/9 05/13/2019 12/16/2018 05/16/2017 10/13/2016  Decreased Interest 1 1 0 0  Down, Depressed, Hopeless - 1 0 0  PHQ - 2 Score 1 2 0 0  Altered sleeping 1 1 1  -  Tired, decreased energy 1 1 1  -  Change in appetite 0 1 1 -  Feeling bad or failure about yourself  0 0 0 -  Trouble concentrating 0 0 0 -  Moving slowly or fidgety/restless 0 0 0 -  Suicidal thoughts 0 0 0 -  PHQ-9 Score 3 5 3  -    GAD 7 : Generalized Anxiety Score 05/13/2019 12/16/2018 05/16/2017  Nervous, Anxious, on Edge 1 2 2   Control/stop worrying 1 2 1   Worry too much - different things 1 2 1   Trouble relaxing 1 2 2   Restless 0 2 0  Easily annoyed or irritable 1 0 2  Afraid - awful might happen 0 2 1  Total GAD 7 Score 5 12 9    Assessment & Plan:  This visit occurred during the  SARS-CoV-2 public health emergency.  Safety protocols were in place, including screening questions prior to the visit, additional usage of staff PPE, and extensive cleaning of exam room while observing appropriate contact time as indicated for disinfecting solutions.   Problem List Items Addressed This Visit    Obesity, Class II, BMI 35-39.9, no comorbidity    Reviewed healthy diet and lifestyle changes to affect sustainable weight loss.        Hyperglycemia    Elevated - encouraged watching added sugars in diet.       Health maintenance examination - Primary    Preventative protocols reviewed and updated unless pt declined. Discussed healthy diet and lifestyle.       GAD (generalized anxiety disorder)    Stable period on celexa 40mg  with PRN lorazepam, will continue current regimen.  Marked improvement in GAD7/PHQ9 RTC 1 yr mood f/u visit.       Relevant Medications   citalopram (CELEXA) 40 MG tablet       Meds ordered this encounter  Medications  . citalopram (CELEXA) 40 MG tablet    Sig: Take 1  tablet (40 mg total) by mouth daily.    Dispense:  90 tablet    Refill:  3   No orders of the defined types were placed in this encounter.   Patient instructions: You are doing well today. Work on regular exercise routine.  Return in 1 year for mood check, fasting.  Return in 2 years for next physical.   Follow up plan: Return in about 1 year (around 05/12/2020) for follow up visit.  Ria Bush, MD

## 2019-05-13 NOTE — Assessment & Plan Note (Signed)
Preventative protocols reviewed and updated unless pt declined. Discussed healthy diet and lifestyle.  

## 2019-05-13 NOTE — Assessment & Plan Note (Addendum)
Stable period on celexa 40mg  with PRN lorazepam, will continue current regimen.  Marked improvement in GAD7/PHQ9 RTC 1 yr mood f/u visit.

## 2019-05-13 NOTE — Assessment & Plan Note (Signed)
Reviewed healthy diet and lifestyle changes to affect sustainable weight loss.  

## 2019-05-13 NOTE — Assessment & Plan Note (Addendum)
Elevated - encouraged watching added sugars in diet.

## 2019-05-16 ENCOUNTER — Ambulatory Visit: Payer: BC Managed Care – PPO | Attending: Oncology

## 2019-05-16 ENCOUNTER — Ambulatory Visit: Payer: BC Managed Care – PPO

## 2019-05-16 DIAGNOSIS — Z23 Encounter for immunization: Secondary | ICD-10-CM

## 2019-05-16 NOTE — Progress Notes (Signed)
   Covid-19 Vaccination Clinic  Name:  Jacqueline Vazquez    MRN: [id number] DOB: 24-Jul-1980  05/16/2019  [name] was observed post Covid-19 immunization for 15 minutes without incident. She was provided with Vaccine Information Sheet and instruction to access the V-Safe system.   [name] was instructed to call 911 with any severe reactions post vaccine: Marland Kitchen Difficulty breathing  . Swelling of face and throat  . A fast heartbeat  . A bad rash all over body  . Dizziness and weakness   Immunizations Administered    Name Date Dose VIS Date Route   Pfizer COVID-19 Vaccine 05/16/2019  8:14 AM 0.3 mL 01/17/2019 Intramuscular   Manufacturer: [place]   Lot: [id number]   [place]: [id number]

## 2019-06-08 ENCOUNTER — Other Ambulatory Visit: Payer: Self-pay | Admitting: Family Medicine

## 2019-06-09 NOTE — Telephone Encounter (Signed)
Last filled 05-08-19 #30 Last OV 05-13-19 No Future OV [facility]

## 2019-06-11 ENCOUNTER — Ambulatory Visit: Payer: BC Managed Care – PPO | Attending: Internal Medicine

## 2019-06-11 DIAGNOSIS — Z23 Encounter for immunization: Secondary | ICD-10-CM

## 2019-06-11 NOTE — Progress Notes (Signed)
   Covid-19 Vaccination Clinic  Name:  Jacqueline Vazquez    MRN: [id number] DOB: [birth year]  06/11/2019  Jacqueline Vazquez was observed post Covid-19 immunization for 15 minutes without incident. She was provided with Vaccine Information Sheet and instruction to access the V-Safe system.   Jacqueline Vazquez was instructed to call 911 with any severe reactions post vaccine: Marland Kitchen Difficulty breathing  . Swelling of face and throat  . A fast heartbeat  . A bad rash all over body  . Dizziness and weakness   Immunizations Administered    Name Date Dose VIS Date Route   Pfizer COVID-19 Vaccine 06/11/2019  8:21 AM 0.3 mL 04/02/2018 Intramuscular   Manufacturer: [place]   Lot: [id number]   [place]: [id number]

## 2019-06-11 NOTE — Telephone Encounter (Signed)
ERx 

## 2019-09-03 ENCOUNTER — Other Ambulatory Visit: Payer: Self-pay | Admitting: Family Medicine

## 2019-09-04 NOTE — Telephone Encounter (Signed)
Last office visit 05/13/2019 for CPE.  Last refilled 06/11/2019 for #30 with 1 refill.  No future appointments.

## 2019-09-05 NOTE — Telephone Encounter (Signed)
ERx 

## 2019-12-29 ENCOUNTER — Other Ambulatory Visit: Payer: Self-pay | Admitting: Family Medicine

## 2019-12-29 NOTE — Telephone Encounter (Signed)
ERx 

## 2020-03-16 ENCOUNTER — Other Ambulatory Visit: Payer: Self-pay

## 2020-03-16 ENCOUNTER — Telehealth (INDEPENDENT_AMBULATORY_CARE_PROVIDER_SITE_OTHER): Payer: BC Managed Care – PPO | Admitting: Family Medicine

## 2020-03-16 ENCOUNTER — Encounter: Payer: Self-pay | Admitting: Family Medicine

## 2020-03-16 DIAGNOSIS — B9689 Other specified bacterial agents as the cause of diseases classified elsewhere: Secondary | ICD-10-CM | POA: Diagnosis not present

## 2020-03-16 DIAGNOSIS — J208 Acute bronchitis due to other specified organisms: Secondary | ICD-10-CM

## 2020-03-16 MED ORDER — AZITHROMYCIN 250 MG PO TABS: ORAL_TABLET | ORAL | 0 refills | Status: DC

## 2020-03-16 NOTE — Assessment & Plan Note (Signed)
Anticipate bacterial infection given initial improvement and now worsening again over the past week. Will Rx azithromycin. Further supportive care reviewed. Update if not improving with treatment.

## 2020-03-16 NOTE — Progress Notes (Signed)
Patient ID: Jacqueline Vazquez, female    DOB: 11-15-1980, 40 y.o.   MRN: [id number]  Virtual visit completed through [employer], a video enabled telemedicine application. Due to national recommendations of social distancing due to COVID-19, a virtual visit is felt to be most appropriate for this patient at this time. Reviewed limitations, risks, security and privacy concerns of performing a virtual visit and the availability of in person appointments. I also reviewed that there may be a patient responsible charge related to this service. The patient agreed to proceed.   Patient location: home Provider location: [place] [facility], office Persons participating in this virtual visit: patient, provider   If any vitals were documented, they were collected by patient at home unless specified below.    BP (!) 130/105   Pulse 89   Temp 97.6 F (36.4 C)   Ht 5' 6.5" (1.689 m)   Wt 234 lb (106.1 kg)   LMP 02/29/2020   BMI 37.20 kg/m   BP Readings from Last 3 Encounters:  03/16/20 (!) 130/105  05/13/19 122/88  12/16/18 (!) 124/96    CC: cough, chest congestion Subjective:   HPI: Jacqueline Vazquez is a 40 y.o. female presenting on 03/16/2020 for Cough (C/o cough and chest congestion.  Also, c/o some facial pain and fatigue.  States sxs started about 2 wks ago as head cold and now has moved to her chest. Has had several neg COVID tests.  Has h/o bronchitis and that's what sxs feel like. )   Several weeks of cold symptoms now progressing into chest, associated with productive cough of colored mucous. Last week was feeling better then this week started feeling ill again. Some facial pressure. Currently chest > head congestion. Mild diarrhea attributed to mucinex. + PNdrainage. Cough can keep her up. She does get coughing fits - overall improving.   No fevers/chills, ear or tooth pain, abd pain, nausea, ST.   Has had several negative COVID tests. Latest one was yesterday done at home (rapid  antigen test).   Treating with mucinex and pseudophed.  No sick contacts at home.  This feels like previous episodes of bronchitis.  No h/o asthma, no wheezing or dyspnea.      Relevant past medical, surgical, family and social history reviewed and updated as indicated. Interim medical history since our last visit reviewed. Allergies and medications reviewed and updated. Outpatient Medications Prior to Visit  Medication Sig Dispense Refill  . citalopram (CELEXA) 40 MG tablet Take 1 tablet (40 mg total) by mouth daily. 90 tablet 3  . esomeprazole (NEXIUM) 20 MG capsule Take 20 mg by mouth daily at 12 noon. OTC    . loratadine (CLARITIN) 10 MG tablet TAKE 1 TABLET BY MOUTH EVERY DAY 90 tablet 3  . LORazepam (ATIVAN) 0.5 MG tablet TAKE 0.5-1 TABLETS (0.25-0.5 MG TOTAL) BY MOUTH 2 (TWO) TIMES DAILY AS NEEDED FOR ANXIETY. 30 tablet 1   No facility-administered medications prior to visit.     Per HPI unless specifically indicated in ROS section below Review of Systems Objective:  BP (!) 130/105   Pulse 89   Temp 97.6 F (36.4 C)   Ht 5' 6.5" (1.689 m)   Wt 234 lb (106.1 kg)   LMP 02/29/2020   BMI 37.20 kg/m   Wt Readings from Last 3 Encounters:  03/16/20 234 lb (106.1 kg)  05/13/19 235 lb 8 oz (106.8 kg)  12/16/18 282 lb 8 oz (128.1 kg)  Physical exam: Gen: alert, NAD, not ill appearing Pulm: speaks in complete sentences without increased work of breathing Psych: normal mood, normal thought content      Assessment & Plan:   Problem List Items Addressed This Visit    Acute bacterial bronchitis    Anticipate bacterial infection given initial improvement and now worsening again over the past week. Will Rx azithromycin. Further supportive care reviewed. Update if not improving with treatment.       Relevant Medications   azithromycin (ZITHROMAX) 250 MG tablet       Meds ordered this encounter  Medications  . azithromycin (ZITHROMAX) 250 MG tablet    Sig: Take  two tablets on day one followed by one tablet on days 2-5    Dispense:  6 each    Refill:  0   No orders of the defined types were placed in this encounter.   I discussed the assessment and treatment plan with the patient. The patient was provided an opportunity to ask questions and all were answered. The patient agreed with the plan and demonstrated an understanding of the instructions. The patient was advised to call back or seek an in-person evaluation if the symptoms worsen or if the condition fails to improve as anticipated.  Follow up plan: Return if symptoms worsen or fail to improve.  Ria Bush, MD

## 2020-03-20 ENCOUNTER — Other Ambulatory Visit: Payer: Self-pay | Admitting: Family Medicine

## 2020-03-22 NOTE — Telephone Encounter (Signed)
ERx 

## 2020-04-21 ENCOUNTER — Telehealth: Payer: Self-pay | Admitting: Family Medicine

## 2020-04-21 NOTE — Telephone Encounter (Signed)
E-scribed refill plz schedule lab and cpe visits.

## 2020-04-27 NOTE — Telephone Encounter (Signed)
Scheduled. EM 

## 2020-05-31 ENCOUNTER — Other Ambulatory Visit: Payer: Self-pay | Admitting: Family Medicine

## 2020-06-13 ENCOUNTER — Other Ambulatory Visit: Payer: Self-pay | Admitting: Family Medicine

## 2020-06-13 DIAGNOSIS — K589 Irritable bowel syndrome without diarrhea: Secondary | ICD-10-CM

## 2020-06-13 DIAGNOSIS — Z1159 Encounter for screening for other viral diseases: Secondary | ICD-10-CM

## 2020-06-13 DIAGNOSIS — E669 Obesity, unspecified: Secondary | ICD-10-CM

## 2020-06-13 DIAGNOSIS — R739 Hyperglycemia, unspecified: Secondary | ICD-10-CM

## 2020-06-15 ENCOUNTER — Other Ambulatory Visit: Payer: Self-pay

## 2020-06-15 ENCOUNTER — Other Ambulatory Visit (INDEPENDENT_AMBULATORY_CARE_PROVIDER_SITE_OTHER): Payer: BC Managed Care – PPO

## 2020-06-15 DIAGNOSIS — E669 Obesity, unspecified: Secondary | ICD-10-CM | POA: Diagnosis not present

## 2020-06-15 DIAGNOSIS — Z1159 Encounter for screening for other viral diseases: Secondary | ICD-10-CM | POA: Diagnosis not present

## 2020-06-15 LAB — COMPREHENSIVE METABOLIC PANEL
ALT: 14 U/L (ref 0–35)
AST: 14 U/L (ref 0–37)
Albumin: 3.9 g/dL (ref 3.5–5.2)
Alkaline Phosphatase: 57 U/L (ref 39–117)
BUN: 15 mg/dL (ref 6–23)
CO2: 28 mEq/L (ref 19–32)
Calcium: 9 mg/dL (ref 8.4–10.5)
Chloride: 101 mEq/L (ref 96–112)
Creatinine, Ser: 0.78 mg/dL (ref 0.40–1.20)
GFR: 95.32 mL/min (ref >60.00)
Glucose, Bld: 103 mg/dL — ABNORMAL HIGH (ref 70–99)
Potassium: 4 mEq/L (ref 3.5–5.1)
Sodium: 137 mEq/L (ref 135–145)
Total Bilirubin: 0.4 mg/dL (ref 0.2–1.2)
Total Protein: 6.9 g/dL (ref 6.0–8.3)

## 2020-06-15 LAB — LIPID PANEL
Cholesterol: 160 mg/dL (ref 0–200)
HDL: 55.3 mg/dL (ref >39.00)
LDL Cholesterol: 81 mg/dL (ref 0–99)
NonHDL: 104.83
Total CHOL/HDL Ratio: 3
Triglycerides: 117 mg/dL (ref 0.0–149.0)
VLDL: 23.4 mg/dL (ref 0.0–40.0)

## 2020-06-16 LAB — HEPATITIS C ANTIBODY
Hepatitis C Ab: NONREACTIVE
SIGNAL TO CUT-OFF: 0.01 (ref <1.00)

## 2020-06-22 ENCOUNTER — Encounter: Payer: Self-pay | Admitting: Family Medicine

## 2020-06-22 ENCOUNTER — Ambulatory Visit (INDEPENDENT_AMBULATORY_CARE_PROVIDER_SITE_OTHER): Payer: BC Managed Care – PPO | Admitting: Family Medicine

## 2020-06-22 ENCOUNTER — Other Ambulatory Visit: Payer: Self-pay

## 2020-06-22 VITALS — BP 120/80 | HR 102 | Temp 97.7°F | Ht 66.5 in | Wt 249.2 lb

## 2020-06-22 DIAGNOSIS — E669 Obesity, unspecified: Secondary | ICD-10-CM

## 2020-06-22 DIAGNOSIS — F411 Generalized anxiety disorder: Secondary | ICD-10-CM | POA: Diagnosis not present

## 2020-06-22 DIAGNOSIS — K580 Irritable bowel syndrome with diarrhea: Secondary | ICD-10-CM | POA: Diagnosis not present

## 2020-06-22 DIAGNOSIS — R739 Hyperglycemia, unspecified: Secondary | ICD-10-CM

## 2020-06-22 DIAGNOSIS — J301 Allergic rhinitis due to pollen: Secondary | ICD-10-CM | POA: Diagnosis not present

## 2020-06-22 DIAGNOSIS — Z0000 Encounter for general adult medical examination without abnormal findings: Secondary | ICD-10-CM | POA: Diagnosis not present

## 2020-06-22 MED ORDER — CITALOPRAM HYDROBROMIDE 40 MG PO TABS: 40.0000 mg | ORAL_TABLET | Freq: Every day | ORAL | 3 refills | Status: DC

## 2020-06-22 MED ORDER — LORAZEPAM 0.5 MG PO TABS: 0.2500 mg | ORAL_TABLET | Freq: Two times a day (BID) | ORAL | 1 refills | Status: DC | PRN

## 2020-06-22 MED ORDER — LORATADINE 10 MG PO TABS: 10.0000 mg | ORAL_TABLET | Freq: Every day | ORAL | 3 refills | Status: DC

## 2020-06-22 NOTE — Assessment & Plan Note (Signed)
Continue yearly claritin.

## 2020-06-22 NOTE — Assessment & Plan Note (Addendum)
Mild. Continue to monitor. Encouraged limiting added sugars in diet.

## 2020-06-22 NOTE — Assessment & Plan Note (Addendum)
Predominant loose stools, stable period. Didn't find probiotic helpful. Discussed possible OTC IBGard trial.

## 2020-06-22 NOTE — Assessment & Plan Note (Signed)
Chronic, stable period on celexa 40mg  daily with PRN lorazepam - desires to continue current regimen.

## 2020-06-22 NOTE — Assessment & Plan Note (Signed)
Preventative protocols reviewed and updated unless pt declined. Discussed healthy diet and lifestyle.  

## 2020-06-22 NOTE — Progress Notes (Signed)
Patient ID: Jacqueline Vazquez, female    DOB: 31-May-1980, 41 y.o.   MRN: [id number]  This visit was conducted in person.  BP 120/80   Pulse (!) 102   Temp 97.7 F (36.5 C) (Temporal)   Ht 5' 6.5" (1.689 m)   Wt 249 lb 4 oz (113.1 kg)   LMP 06/17/2020   SpO2 97%   BMI 39.63 kg/m    CC: CPE  Subjective:   HPI: [name] is a 40 y.o. female presenting on 06/22/2020 for Annual Exam   H/o IBS - overall well controlled.   Preventative: Well woman exam - with OBGYN Dr Vickey Sages at [facility] 01/2019. upcoming appt 09/2020 - planning first mammo and pap.  LMP last week, regular monthly Flu shot yearly  COVID vaccine 05/2019, 06/2019, booster 01/2020 Tdap 2013 Seat belt use discussed  Sunscreen use discussed. No changing moles on skin.  Ex smoker - quit 2014, previously 12 PY hx. Husband smokes outdoors.  Alcohol - 1-2 glasses of wine intermittently  Dentist q6 mo  Eye exam - due  Lives with husband and daughter, 1 dog. Mother Jacqueline Vazquez) recently moved in with them. Occupation: Office manager  Edu: college  Activity: no regular exercise Diet: good water, fruits/vegetables regularly, 50/50 prepared vs convenience meals      Relevant past medical, surgical, family and social history reviewed and updated as indicated. Interim medical history since our last visit reviewed. Allergies and medications reviewed and updated. Outpatient Medications Prior to Visit  Medication Sig Dispense Refill  . esomeprazole (NEXIUM) 20 MG capsule Take 20 mg by mouth daily at 12 noon. OTC    . citalopram (CELEXA) 40 MG tablet TAKE 1 TABLET BY MOUTH EVERY DAY 30 tablet 0  . loratadine (CLARITIN) 10 MG tablet TAKE 1 TABLET BY MOUTH EVERY DAY 30 tablet 2  . LORazepam (ATIVAN) 0.5 MG tablet TAKE 0.5-1 TABLETS (0.25-0.5 MG TOTAL) BY MOUTH 2 (TWO) TIMES DAILY AS NEEDED FOR ANXIETY. 30 tablet 1   No facility-administered medications prior to visit.     Per HPI unless  specifically indicated in ROS section below Review of Systems  Constitutional: Negative for activity change, appetite change, chills, fatigue, fever and unexpected weight change.  HENT: Negative for hearing loss.   Eyes: Negative for visual disturbance.  Respiratory: Negative for cough, chest tightness, shortness of breath and wheezing.   Cardiovascular: Negative for chest pain, palpitations and leg swelling.  Gastrointestinal: Negative for abdominal distention, abdominal pain, blood in stool, constipation, diarrhea, nausea and vomiting.  Genitourinary: Negative for difficulty urinating and hematuria.  Musculoskeletal: Negative for arthralgias, myalgias and neck pain.  Skin: Negative for rash.  Neurological: Negative for dizziness, seizures, syncope and headaches.  Hematological: Negative for adenopathy. Bruises/bleeds easily.  Psychiatric/Behavioral: Negative for dysphoric mood. The patient is nervous/anxious.    Objective:  BP 120/80   Pulse (!) 102   Temp 97.7 F (36.5 C) (Temporal)   Ht 5' 6.5" (1.689 m)   Wt 249 lb 4 oz (113.1 kg)   LMP 06/17/2020   SpO2 97%   BMI 39.63 kg/m   Wt Readings from Last 3 Encounters:  06/22/20 249 lb 4 oz (113.1 kg)  03/16/20 234 lb (106.1 kg)  05/13/19 235 lb 8 oz (106.8 kg)      Physical Exam Vitals and nursing note reviewed.  Constitutional:      General: She is not in acute distress.    Appearance: Normal appearance. She  is well-developed. She is not ill-appearing.  HENT:     Head: Normocephalic and atraumatic.     Right Ear: Hearing, tympanic membrane, ear canal and external ear normal.     Left Ear: Hearing, tympanic membrane, ear canal and external ear normal.  Eyes:     General: No scleral icterus.    Extraocular Movements: Extraocular movements intact.     Conjunctiva/sclera: Conjunctivae normal.     Pupils: Pupils are equal, round, and reactive to light.  Neck:     Thyroid: No thyroid mass or thyromegaly.  Cardiovascular:      Rate and Rhythm: Normal rate and regular rhythm.     Pulses: Normal pulses.          Radial pulses are 2+ on the right side and 2+ on the left side.     Heart sounds: Normal heart sounds. No murmur heard.   Pulmonary:     Effort: Pulmonary effort is normal. No respiratory distress.     Breath sounds: Normal breath sounds. No wheezing, rhonchi or rales.  Abdominal:     General: Bowel sounds are normal. There is no distension.     Palpations: Abdomen is soft. There is no mass.     Tenderness: There is no abdominal tenderness. There is no guarding or rebound.     Hernia: No hernia is present.  Musculoskeletal:        General: Normal range of motion.     Cervical back: Normal range of motion and neck supple.     Right lower leg: No edema.     Left lower leg: No edema.  Lymphadenopathy:     Cervical: No cervical adenopathy.  Skin:    General: Skin is warm and dry.     Findings: No rash.  Neurological:     General: No focal deficit present.     Mental Status: She is alert and oriented to person, place, and time.     Comments: CN grossly intact, station and gait intact  Psychiatric:        Mood and Affect: Mood normal.        Behavior: Behavior normal.        Thought Content: Thought content normal.        Judgment: Judgment normal.       Results for orders placed or performed in visit on 06/15/20  Hepatitis C antibody  Result Value Ref Range   Hepatitis C Ab NON-REACTIVE NON-REACTIVE   SIGNAL TO CUT-OFF 0.01 <1.00  Comprehensive metabolic panel  Result Value Ref Range   Sodium 137 135 - 145 mEq/L   Potassium 4.0 3.5 - 5.1 mEq/L   Chloride 101 96 - 112 mEq/L   CO2 28 19 - 32 mEq/L   Glucose, Bld 103 (H) 70 - 99 mg/dL   BUN 15 6 - 23 mg/dL   Creatinine, Ser 0.78 0.40 - 1.20 mg/dL   Total Bilirubin 0.4 0.2 - 1.2 mg/dL   Alkaline Phosphatase 57 39 - 117 U/L   AST 14 0 - 37 U/L   ALT 14 0 - 35 U/L   Total Protein 6.9 6.0 - 8.3 g/dL   Albumin 3.9 3.5 - 5.2 g/dL   GFR  95.32 >60.00 mL/min   Calcium 9.0 8.4 - 10.5 mg/dL  Lipid panel  Result Value Ref Range   Cholesterol 160 0 - 200 mg/dL   Triglycerides 117.0 0.0 - 149.0 mg/dL   HDL 55.30 >39.00 mg/dL   VLDL 23.4 0.0 -  40.0 mg/dL   LDL Cholesterol 81 0 - 99 mg/dL   Total CHOL/HDL Ratio 3    NonHDL 104.83    Depression screen [facility] 2/9 06/22/2020 05/13/2019 12/16/2018 05/16/2017 10/13/2016  Decreased Interest 0 1 1 0 0  Down, Depressed, Hopeless 0 - 1 0 0  PHQ - 2 Score 0 1 2 0 0  Altered sleeping 1 1 1 1  -  Tired, decreased energy 1 1 1 1  -  Change in appetite 2 0 1 1 -  Feeling bad or failure about yourself  0 0 0 0 -  Trouble concentrating 0 0 0 0 -  Moving slowly or fidgety/restless 0 0 0 0 -  Suicidal thoughts 0 0 0 0 -  PHQ-9 Score 4 3 5 3  -    GAD 7 : Generalized Anxiety Score 06/22/2020 05/13/2019 12/16/2018 05/16/2017  Nervous, Anxious, on Edge 1 1 2 2   Control/stop worrying 1 1 2 1   Worry too much - different things 1 1 2 1   Trouble relaxing 2 1 2 2   Restless 1 0 2 0  Easily annoyed or irritable 1 1 0 2  Afraid - awful might happen 1 0 2 1  Total GAD 7 Score 8 5 12 9    Assessment & Plan:  This visit occurred during the SARS-CoV-2 public health emergency.  Safety protocols were in place, including screening questions prior to the visit, additional usage of staff PPE, and extensive cleaning of exam room while observing appropriate contact time as indicated for disinfecting solutions.   Problem List Items Addressed This Visit    Irritable bowel syndrome    Predominant loose stools, stable period. Didn't find probiotic helpful. Discussed possible OTC IBGard trial.      GAD (generalized anxiety disorder)    Chronic, stable period on celexa 40mg  daily with PRN lorazepam - desires to continue current regimen.       Relevant Medications   citalopram (CELEXA) 40 MG tablet   LORazepam (ATIVAN) 0.5 MG tablet   Seasonal allergic rhinitis due to pollen    Continue yearly claritin.       Health  maintenance examination - Primary    Preventative protocols reviewed and updated unless pt declined. Discussed healthy diet and lifestyle.       Obesity, Class II, BMI 35-39.9, no comorbidity    Encouraged healthy diet and lifestyle choices to affect sustainable weight loss. Discussed AHA recommended amount of weekly moderate intensity aerobic exercise.      Hyperglycemia    Mild. Continue to monitor. Encouraged limiting added sugars in diet.           Meds ordered this encounter  Medications  . citalopram (CELEXA) 40 MG tablet    Sig: Take 1 tablet (40 mg total) by mouth daily.    Dispense:  90 tablet    Refill:  3  . LORazepam (ATIVAN) 0.5 MG tablet    Sig: Take 0.5-1 tablets (0.25-0.5 mg total) by mouth 2 (two) times daily as needed for anxiety.    Dispense:  30 tablet    Refill:  1    Not to exceed 4 additional fills before 06/26/2020  . loratadine (CLARITIN) 10 MG tablet    Sig: Take 1 tablet (10 mg total) by mouth daily.    Dispense:  90 tablet    Refill:  3   No orders of the defined types were placed in this encounter.   Patient instructions: Look into IBGard over the counter as  needed for IBS symptoms.  You are doing well today Start walking routine.  Continue current medicines Return as needed or in 1 year for next physical.   Follow up plan: Return in about 1 year (around 06/22/2021) for annual exam, prior fasting for blood work.  Ria Bush, MD

## 2020-06-22 NOTE — Assessment & Plan Note (Signed)
Encouraged healthy diet and lifestyle choices to affect sustainable weight loss. Discussed AHA recommended amount of weekly moderate intensity aerobic exercise.

## 2020-06-22 NOTE — Patient Instructions (Addendum)
Look into IBGard over the counter as needed for IBS symptoms.  You are doing well today Start walking routine.  Continue current medicines Return as needed or in 1 year for next physical.   Health Maintenance, Female Adopting a healthy lifestyle and getting preventive care are important in promoting health and wellness. Ask your health care provider about:  The right schedule for you to have regular tests and exams.  Things you can do on your own to prevent diseases and keep yourself healthy. What should I know about diet, weight, and exercise? Eat a healthy diet  Eat a diet that includes plenty of vegetables, fruits, low-fat dairy products, and lean protein.  Do not eat a lot of foods that are high in solid fats, added sugars, or sodium.   Maintain a healthy weight Body mass index (BMI) is used to identify weight problems. It estimates body fat based on height and weight. Your health care provider can help determine your BMI and help you achieve or maintain a healthy weight. Get regular exercise Get regular exercise. This is one of the most important things you can do for your health. Most adults should:  Exercise for at least 150 minutes each week. The exercise should increase your heart rate and make you sweat (moderate-intensity exercise).  Do strengthening exercises at least twice a week. This is in addition to the moderate-intensity exercise.  Spend less time sitting. Even light physical activity can be beneficial. Watch cholesterol and blood lipids Have your blood tested for lipids and cholesterol at 40 years of age, then have this test every 5 years. Have your cholesterol levels checked more often if:  Your lipid or cholesterol levels are high.  You are older than 40 years of age.  You are at high risk for heart disease. What should I know about cancer screening? Depending on your health history and family history, you may need to have cancer screening at various ages.  This may include screening for:  Breast cancer.  Cervical cancer.  Colorectal cancer.  Skin cancer.  Lung cancer. What should I know about heart disease, diabetes, and high blood pressure? Blood pressure and heart disease  High blood pressure causes heart disease and increases the risk of stroke. This is more likely to develop in people who have high blood pressure readings, are of African descent, or are overweight.  Have your blood pressure checked: ? Every 3-5 years if you are 26-52 years of age. ? Every year if you are 16 years old or older. Diabetes Have regular diabetes screenings. This checks your fasting blood sugar level. Have the screening done:  Once every three years after age 100 if you are at a normal weight and have a low risk for diabetes.  More often and at a younger age if you are overweight or have a high risk for diabetes. What should I know about preventing infection? Hepatitis B If you have a higher risk for hepatitis B, you should be screened for this virus. Talk with your health care provider to find out if you are at risk for hepatitis B infection. Hepatitis C Testing is recommended for:  Everyone born from [birth year] through 1965.  Anyone with known risk factors for hepatitis C. Sexually transmitted infections (STIs)  Get screened for STIs, including gonorrhea and chlamydia, if: ? You are sexually active and are younger than 40 years of age. ? You are older than 40 years of age and your health care provider tells you  that you are at risk for this type of infection. ? Your sexual activity has changed since you were last screened, and you are at increased risk for chlamydia or gonorrhea. Ask your health care provider if you are at risk.  Ask your health care provider about whether you are at high risk for HIV. Your health care provider may recommend a prescription medicine to help prevent HIV infection. If you choose to take medicine to prevent HIV, you  should first get tested for HIV. You should then be tested every 3 months for as long as you are taking the medicine. Pregnancy  If you are about to stop having your period (premenopausal) and you may become pregnant, seek counseling before you get pregnant.  Take 400 to 800 micrograms (mcg) of folic acid every day if you become pregnant.  Ask for birth control (contraception) if you want to prevent pregnancy. Osteoporosis and menopause Osteoporosis is a disease in which the bones lose minerals and strength with aging. This can result in bone fractures. If you are 35 years old or older, or if you are at risk for osteoporosis and fractures, ask your health care provider if you should:  Be screened for bone loss.  Take a calcium or vitamin D supplement to lower your risk of fractures.  Be given hormone replacement therapy (HRT) to treat symptoms of menopause. Follow these instructions at home: Lifestyle  Do not use any products that contain nicotine or tobacco, such as cigarettes, e-cigarettes, and chewing tobacco. If you need help quitting, ask your health care provider.  Do not use street drugs.  Do not share needles.  Ask your health care provider for help if you need support or information about quitting drugs. Alcohol use  Do not drink alcohol if: ? Your health care provider tells you not to drink. ? You are pregnant, may be pregnant, or are planning to become pregnant.  If you drink alcohol: ? Limit how much you use to 0-1 drink a day. ? Limit intake if you are breastfeeding.  Be aware of how much alcohol is in your drink. In the [place] one drink equals one 12 oz bottle of beer (355 mL), one 5 oz glass of wine (148 mL), or one 1 oz glass of hard liquor (44 mL). General instructions  Schedule regular health, dental, and eye exams.  Stay current with your vaccines.  Tell your health care provider if: ? You often feel depressed. ? You have ever been abused or do not feel  safe at home. Summary  Adopting a healthy lifestyle and getting preventive care are important in promoting health and wellness.  Follow your health care provider's instructions about healthy diet, exercising, and getting tested or screened for diseases.  Follow your health care provider's instructions on monitoring your cholesterol and blood pressure. This information is not intended to replace advice given to you by your health care provider. Make sure you discuss any questions you have with your health care provider. Document Revised: 01/16/2018 Document Reviewed: 01/16/2018 Elsevier Patient Education  2021 [employer].

## 2020-08-24 ENCOUNTER — Encounter: Payer: Self-pay | Admitting: Emergency Medicine

## 2020-08-24 ENCOUNTER — Other Ambulatory Visit: Payer: Self-pay

## 2020-08-24 ENCOUNTER — Ambulatory Visit
Admission: EM | Admit: 2020-08-24 | Discharge: 2020-08-24 | Disposition: A | Discharge status: Home / Self Care | Payer: BC Managed Care – PPO | Attending: Emergency Medicine | Admitting: Emergency Medicine

## 2020-08-24 DIAGNOSIS — N200 Calculus of kidney: Secondary | ICD-10-CM | POA: Diagnosis not present

## 2020-08-24 LAB — POCT URINALYSIS DIP (MANUAL ENTRY)
Bilirubin, UA: NEGATIVE
Glucose, UA: NEGATIVE mg/dL
Ketones, POC UA: NEGATIVE mg/dL
Leukocytes, UA: NEGATIVE
Nitrite, UA: NEGATIVE
Protein Ur, POC: NEGATIVE mg/dL
Spec Grav, UA: 1.015 (ref 1.010–1.025)
Urobilinogen, UA: 0.2 E.U./dL
pH, UA: 7 (ref 5.0–8.0)

## 2020-08-24 MED ORDER — TRAMADOL HCL 50 MG PO TABS: 50.0000 mg | ORAL_TABLET | Freq: Four times a day (QID) | ORAL | 0 refills | Status: AC | PRN

## 2020-08-24 MED ORDER — TAMSULOSIN HCL 0.4 MG PO CAPS: 0.4000 mg | ORAL_CAPSULE | Freq: Every day | ORAL | 0 refills | Status: AC

## 2020-08-24 NOTE — ED Triage Notes (Signed)
Pt presents today with c/o of left flank/back pain that radiates to left groin x 2 days. +hesitancy, +nausea, denies v/d or fever.

## 2020-08-24 NOTE — ED Provider Notes (Signed)
Subjective:    Jacqueline Vazquez is a very pleasant 40 y.o. female who presents with concerns for UTI vs kidney stone due to left flank pain radiating to left groin for the last 2 days.  Patient reports hesitancy of urination and nausea.  Patient states that she has a history of kidney stones for which are sodium deposits rather than calcium deposits.  Patient states that she has never had to have lithotripsy completed due to her kidney stones.  Patient states that the pain originally started in her left hip and she thought that she had slept wrong, but states that the pain continued "creeping around" into the left groin region which made her suspicious for a kidney stone.  Patient does not report any hematuria. No vomiting, fever, vaginal discharge, concern for STD reported.  Past medical history, past surgical history, current medications reviewed.  Allergies: is allergic to percocet [oxycodone-acetaminophen] and flagyl [metronidazole hcl].  Review of Systems See HPI   Objective:     Vitals:   08/24/20 0844  BP: 122/83  Pulse: 83  Resp: 18  Temp: 98.4 F (36.9 C)  SpO2: 97%     General: Appears well-developed and well-nourished. No acute distress.  Cardiovascular: Normal rate  Pulm/Chest: No respiratory distress Abdominal: Soft, non-distended, and non-tender without rebound or guarding. No CVAT.  Neurological: Alert and oriented to person, place, and time.  Skin: Skin is warm and dry.  Psychiatric: Normal mood, affect, behavior, and thought content.  GU:  Deferred secondary to self collect specimen  Laboratory:  Orders Placed This Encounter  Procedures   POCT urinalysis dipstick   Results for orders placed or performed during the hospital encounter of 08/24/20  POCT urinalysis dipstick  Result Value Ref Range   Color, UA light yellow (A) yellow   Clarity, UA clear clear   Glucose, UA negative negative mg/dL   Bilirubin, UA negative negative   Ketones, POC UA negative  negative mg/dL   Spec Grav, UA 1.015 1.010 - 1.025   Blood, UA trace-intact (A) negative   pH, UA 7.0 5.0 - 8.0   Protein Ur, POC negative negative mg/dL   Urobilinogen, UA 0.2 0.2 or 1.0 E.U./dL   Nitrite, UA Negative Negative   Leukocytes, UA Negative Negative    -Urinalysis reveals light yellow urine with trace blood, otherwise negative. Assessment:   1. Kidney stone on left side  Meds ordered this encounter  Medications   tamsulosin (FLOMAX) 0.4 MG CAPS capsule    Sig: Take 1 capsule (0.4 mg total) by mouth daily for 14 days.    Dispense:  14 capsule    Refill:  0    Order Specific Question:   Supervising Provider    Answer:   Chase Picket [[id number]]   traMADol (ULTRAM) 50 MG tablet    Sig: Take 1 tablet (50 mg total) by mouth every 6 (six) hours as needed for up to 3 days.    Dispense:  12 tablet    Refill:  0    Order Specific Question:   Supervising Provider    Answer:   Chase Picket [id number]      Plan:   MDM: Patient presents with concerns for UTI vs kidney stone due to left flank pain radiating to left groin for the last 2 days.  Patient reports hesitancy of urination and nausea.  Patient states that she has a history of kidney stones for which are sodium deposits rather than calcium deposits.  Patient  states that she has never had to have lithotripsy completed due to her kidney stones.  Patient states that the pain originally started in her left hip and she thought that she had slept wrong, but states that the pain continued "creeping around" into the left groin region which made her suspicious for a kidney stone.  Patient does not report any hematuria. No vomiting, fever, vaginal discharge, concern for STD reported.  Chart review completed.  Urinalysis reveals light yellow urine with trace blood, otherwise negative.  Given symptoms along with assessment findings and history, likely kidney stone.  Rx Ultram and Flomax to the patient's preferred pharmacy and advised  of straining each urine.  Patient provided a strainer.  Advised of strict emergency department precautions for worsening of symptoms concerning for hydronephrosis or infection.  Patient verbalized understanding and agreed with plan.  Patient stable upon discharge.  Return as needed.    Discharge Instructions      If you begin to have worsening discomfort to the left flank region, fever, chills, vomiting you need to go to the emergency department for further investigation.  Strain each urine.  Follow-up with your primary care within 1 week for recheck.       Serafina Royals, FNP-C 08/24/20  A copy of these instructions was provided to the patient or responsible parent/guardian, who expressed understanding and agreed with the treatment plan.  All questions addressed.  This note was partially made with the aid of speech-to-text dictation; typographical errors are not intentional.    Serafina Royals, [facility] 08/24/20 [phone]

## 2020-08-24 NOTE — Discharge Instructions (Addendum)
If you begin to have worsening discomfort to the left flank region, fever, chills, vomiting you need to go to the emergency department for further investigation.  Strain each urine.  Follow-up with your primary care within 1 week for recheck.

## 2020-08-26 ENCOUNTER — Ambulatory Visit: Payer: BC Managed Care – PPO | Admitting: Nurse Practitioner

## 2020-08-26 ENCOUNTER — Encounter: Payer: Self-pay | Admitting: Nurse Practitioner

## 2020-08-26 ENCOUNTER — Other Ambulatory Visit: Payer: Self-pay

## 2020-08-26 VITALS — BP 108/82 | HR 90 | Temp 97.6°F | Resp 16 | Ht 66.5 in | Wt 250.5 lb

## 2020-08-26 DIAGNOSIS — N200 Calculus of kidney: Secondary | ICD-10-CM | POA: Diagnosis not present

## 2020-08-26 DIAGNOSIS — R350 Frequency of micturition: Secondary | ICD-10-CM | POA: Insufficient documentation

## 2020-08-26 LAB — CBC
HCT: 39 % (ref 36.0–46.0)
Hemoglobin: 13 g/dL (ref 12.0–15.0)
MCHC: 33.4 g/dL (ref 30.0–36.0)
MCV: 89.3 fl (ref 78.0–100.0)
Platelets: 224 10*3/uL (ref 150.0–400.0)
RBC: 4.36 Mil/uL (ref 3.87–5.11)
RDW: 14.1 % (ref 11.5–15.5)
WBC: 4.9 10*3/uL (ref 4.0–10.5)

## 2020-08-26 LAB — POCT URINALYSIS DIPSTICK
Bilirubin, UA: NEGATIVE
Blood, UA: NEGATIVE
Glucose, UA: NEGATIVE
Ketones, UA: NEGATIVE
Leukocytes, UA: NEGATIVE
Nitrite, UA: NEGATIVE
Protein, UA: NEGATIVE
Spec Grav, UA: <=1.005 — AB (ref 1.010–1.025)
Urobilinogen, UA: 0.2 E.U./dL
pH, UA: 6 (ref 5.0–8.0)

## 2020-08-26 LAB — BASIC METABOLIC PANEL
BUN: 10 mg/dL (ref 6–23)
CO2: 26 mEq/L (ref 19–32)
Calcium: 8.9 mg/dL (ref 8.4–10.5)
Chloride: 103 mEq/L (ref 96–112)
Creatinine, Ser: 0.8 mg/dL (ref 0.40–1.20)
GFR: 92.34 mL/min (ref >60.00)
Glucose, Bld: 86 mg/dL (ref 70–99)
Potassium: 3.9 mEq/L (ref 3.5–5.1)
Sodium: 137 mEq/L (ref 135–145)

## 2020-08-26 NOTE — Progress Notes (Signed)
Acute Office Visit  Subjective:    Patient ID: Jacqueline Vazquez, female    DOB: 1980/11/15, 40 y.o.   MRN: [id number]  Chief Complaint  Patient presents with   Follow-up    Urgent Care follow up on kidney stone. Taking medications as prescribed. Pain is better but the urgency to urinate still comes and goes. Feeling a little nauseas today. Has not seen any stones in the strainer she was given    HPI Patient is in today for UC follow up. States she went and dx with kidney stone, has been straining urine without note of stone. Does complain of frequency, nausea, and urgency. Denies fever, chills, bowel issues. Had some left sided flank pain that has improved. Was prescribed flomax and tramadol at the UC. States they did not order any imaging or blood work at her visit.   Bowel movements still normal despite the Tramadol use.  Long standing history of kidney stones starting around her 83s and then easing up in her towards her 57s. Was established with a urology clinic but that has been well over 3 years ago.  This visit occurred during the SARS-CoV-2 public health emergency.  Safety protocols were in place, including screening questions prior to the visit, additional usage of staff PPE, and extensive cleaning of exam room while observing appropriate contact time as indicated for disinfecting solutions.     Past Medical History:  Diagnosis Date   Gestational hypertension    Heartburn in pregnancy    History of depression college   resolved with 2 yrs zoloft   History of kidney stones 2012   Hx of migraines    Irritable bowel syndrome    Pregnancy complication    placenta accreta with hemorrhage with ICU stay, gest HTN    Past Surgical History:  Procedure Laterality Date   CESAREAN SECTION  11/23/2011   Surgeon: Marylynn Pearson, MD; PRIMARY   COLONOSCOPY  2008   IBS, int hemmorhoids, blood with BMs ([place])   DILATION AND CURETTAGE OF UTERUS  2011   miscarriage    ESOPHAGOGASTRODUODENOSCOPY  2008   ([place])   WISDOM TOOTH EXTRACTION      Family History  Problem Relation Age of Onset   Hypertension Father    Diabetes Father    Diabetes Maternal Grandmother    Hyperlipidemia Father    Depression Mother        with anxiety   Cancer Maternal Grandfather        esophageal and lung (smoker)   Heart disease Father        unsure details   Cancer Sister        ovarian/cervical   Stroke Neg Hx     Social History   Socioeconomic History   Marital status: Married    Spouse name: Not on file   Number of children: Not on file   Years of education: Not on file   Highest education level: Not on file  Occupational History   Not on file  Tobacco Use   Smoking status: Former    Types: Cigarettes    Quit date: 12/15/2012    Years since quitting: 7.7   Smokeless tobacco: Never  Substance and Sexual Activity   Alcohol use: No    Alcohol/week: 0.0 standard drinks   Drug use: No   Sexual activity: Yes    Birth control/protection: None    Comment: quit smoking with preg  Other Topics Concern   Not on file  Social History Narrative   Lives with husband and daughter, 1 dog. Mother Luiz Blare) moved in 04-29-2019) after father passed away.    Occupation: Location manager   Edu: college   Activity: no regular exercise   Diet: diet pepsi, fruits/vegetables daily   A+ blood type   Social Determinants of Radio broadcast assistant Strain: Not on file  Food Insecurity: Not on file  Transportation Needs: Not on file  Physical Activity: Not on file  Stress: Not on file  Social Connections: Not on file  Intimate Partner Violence: Not on file    Outpatient Medications Prior to Visit  Medication Sig Dispense Refill   citalopram (CELEXA) 40 MG tablet Take 1 tablet (40 mg total) by mouth daily. 90 tablet 3   esomeprazole (NEXIUM) 20 MG capsule Take 20 mg by mouth daily at 12 noon. OTC     loratadine (CLARITIN) 10 MG tablet Take 1  tablet (10 mg total) by mouth daily. 90 tablet 3   LORazepam (ATIVAN) 0.5 MG tablet Take 0.5-1 tablets (0.25-0.5 mg total) by mouth 2 (two) times daily as needed for anxiety. 30 tablet 1   tamsulosin (FLOMAX) 0.4 MG CAPS capsule Take 1 capsule (0.4 mg total) by mouth daily for 14 days. 14 capsule 0   traMADol (ULTRAM) 50 MG tablet Take 1 tablet (50 mg total) by mouth every 6 (six) hours as needed for up to 3 days. 12 tablet 0   No facility-administered medications prior to visit.    Allergies  Allergen Reactions   Percocet [Oxycodone-Acetaminophen] Nausea And Vomiting   Flagyl [Metronidazole Hcl] Rash    Review of Systems  Constitutional:  Positive for chills. Negative for fever.  Respiratory:  Negative for cough and shortness of breath.   Cardiovascular:  Negative for chest pain.  Gastrointestinal:  Positive for nausea. Negative for abdominal pain, constipation, diarrhea and vomiting.  Genitourinary:  Positive for flank pain, frequency and urgency. Negative for difficulty urinating, dysuria and hematuria.      Objective:    Physical Exam Constitutional:      Appearance: Normal appearance.  Cardiovascular:     Rate and Rhythm: Normal rate and regular rhythm.     Heart sounds: Normal heart sounds.  Abdominal:     General: There is no distension.     Tenderness: There is abdominal tenderness. There is no right CVA tenderness or left CVA tenderness.    Musculoskeletal:        General: No tenderness.     Right hip: No tenderness. Normal range of motion. Normal strength.  Neurological:     Mental Status: She is alert.    BP 108/82   Pulse 90   Temp 97.6 F (36.4 C)   Resp 16   Ht 5' 6.5" (1.689 m)   Wt 250 lb 8 oz (113.6 kg)   LMP 07/31/2020 (Exact Date)   SpO2 98%   BMI 39.83 kg/m  Wt Readings from Last 3 Encounters:  08/26/20 250 lb 8 oz (113.6 kg)  06/22/20 249 lb 4 oz (113.1 kg)  03/16/20 234 lb (106.1 kg)    Health Maintenance Due  Topic Date Due   PAP  SMEAR-Modifier  03/09/2020        Assessment & Plan:   Problem List Items Addressed This Visit   None    No orders of the defined types were placed in this encounter.    Romilda Garret, NP

## 2020-08-26 NOTE — Assessment & Plan Note (Signed)
Has been happening since her evaluation at [facility]. Has not resolved. UA in office today WNL. Will culture urine to be certain no missed infection or blood

## 2020-08-26 NOTE — Assessment & Plan Note (Signed)
Seen at [facility] on 08/24/2020. Was dx with kidney stone. Hx of the same. No imaging or blood work done. Patient was instructed to strain her urine and has been doing so. She has not seen a stone in her urine yet. Still having symptoms, although improved. Will obtain US of kidney to see if there are any stones present. UA today was WNL. No signs of blood or infection.

## 2020-08-26 NOTE — Patient Instructions (Addendum)
Your Urine looked normal today. No signs of blood or infection We will check some basic labs in office. I will notify you when they are resulted. They will go onto your my chart. Continue to strain your urine and take the medications that the urgent care prescribed. Monitor your symptoms, If they continue and DO NOT improve please let me know. We will pursue a [place] of your kidney to see if it shows any stones.  It was a pleasure meeting you today.

## 2020-08-27 LAB — URINE CULTURE
MICRO NUMBER:: 12147342
SPECIMEN QUALITY:: ADEQUATE

## 2020-08-28 ENCOUNTER — Encounter: Payer: Self-pay | Admitting: Family Medicine

## 2020-08-30 NOTE — Telephone Encounter (Signed)
Lvm asking pt to call back.  Need to get DOB for pt's mom.  Also, sending MyChart message.

## 2020-09-06 ENCOUNTER — Other Ambulatory Visit: Payer: Self-pay | Admitting: Family Medicine

## 2020-09-07 NOTE — Telephone Encounter (Signed)
Name of Medication: Lorazepam Name of Pharmacy: [facility] Last Fill or Written Date and Quantity: 08/03/20, #30 Last Office Visit and Type: 06/22/20, CPE Next Office Visit and Type: none Last Controlled Substance Agreement Date: none Last UDS: none

## 2020-09-09 NOTE — Telephone Encounter (Signed)
ERx 

## 2020-09-21 DIAGNOSIS — E669 Obesity, unspecified: Secondary | ICD-10-CM | POA: Diagnosis not present

## 2020-09-21 DIAGNOSIS — Z01419 Encounter for gynecological examination (general) (routine) without abnormal findings: Secondary | ICD-10-CM | POA: Diagnosis not present

## 2020-09-21 DIAGNOSIS — R5383 Other fatigue: Secondary | ICD-10-CM | POA: Diagnosis not present

## 2020-09-21 DIAGNOSIS — Z1231 Encounter for screening mammogram for malignant neoplasm of breast: Secondary | ICD-10-CM | POA: Diagnosis not present

## 2020-09-21 DIAGNOSIS — Z6838 Body mass index (BMI) 38.0-38.9, adult: Secondary | ICD-10-CM | POA: Diagnosis not present

## 2020-09-21 DIAGNOSIS — R61 Generalized hyperhidrosis: Secondary | ICD-10-CM | POA: Diagnosis not present

## 2020-09-23 ENCOUNTER — Other Ambulatory Visit: Payer: Self-pay | Admitting: Obstetrics and Gynecology

## 2020-09-23 DIAGNOSIS — R928 Other abnormal and inconclusive findings on diagnostic imaging of breast: Secondary | ICD-10-CM

## 2020-10-06 ENCOUNTER — Ambulatory Visit: Payer: BC Managed Care – PPO

## 2020-10-07 ENCOUNTER — Other Ambulatory Visit: Payer: Self-pay

## 2020-10-07 ENCOUNTER — Ambulatory Visit
Admission: RE | Admit: 2020-10-07 | Discharge: 2020-10-07 | Disposition: A | Discharge status: Home / Self Care | Payer: BC Managed Care – PPO | Source: Ambulatory Visit | Attending: Obstetrics and Gynecology | Admitting: Obstetrics and Gynecology

## 2020-10-07 DIAGNOSIS — N6002 Solitary cyst of left breast: Secondary | ICD-10-CM | POA: Diagnosis not present

## 2020-10-07 DIAGNOSIS — R928 Other abnormal and inconclusive findings on diagnostic imaging of breast: Secondary | ICD-10-CM

## 2020-10-08 ENCOUNTER — Other Ambulatory Visit: Payer: BC Managed Care – PPO

## 2020-11-11 ENCOUNTER — Other Ambulatory Visit: Payer: Self-pay | Admitting: Family Medicine

## 2020-11-12 NOTE — Telephone Encounter (Signed)
Name of Medication: Lorazepam Name of Pharmacy: [facility] Last Fill or Written Date and Quantity: 10/05/20, #30 Last Office Visit and Type: 06/22/20, CPE Next Office Visit and Type: none Last Controlled Substance Agreement Date: none Last UDS: none

## 2020-11-12 NOTE — Telephone Encounter (Signed)
ERx 

## 2021-03-29 ENCOUNTER — Encounter: Payer: Self-pay | Admitting: Emergency Medicine

## 2021-03-29 ENCOUNTER — Other Ambulatory Visit: Payer: Self-pay

## 2021-03-29 ENCOUNTER — Ambulatory Visit
Admission: EM | Admit: 2021-03-29 | Discharge: 2021-03-29 | Disposition: A | Discharge status: Home / Self Care | Payer: BC Managed Care – PPO | Attending: Emergency Medicine | Admitting: Emergency Medicine

## 2021-03-29 DIAGNOSIS — J029 Acute pharyngitis, unspecified: Secondary | ICD-10-CM

## 2021-03-29 DIAGNOSIS — B349 Viral infection, unspecified: Secondary | ICD-10-CM

## 2021-03-29 LAB — POCT RAPID STREP A (OFFICE): Rapid Strep A Screen: NEGATIVE

## 2021-03-29 MED ORDER — LIDOCAINE VISCOUS HCL 2 % MT SOLN: 15.0000 mL | OROMUCOSAL | 0 refills | Status: DC | PRN

## 2021-03-29 NOTE — Discharge Instructions (Addendum)
Your strep test is negative.  Your COVID and Flu tests are pending.  You should self quarantine until the test results are back.    Use the viscous lidocaine as discussed.  Take Tylenol or ibuprofen as needed for fever or discomfort.  Rest and keep yourself hydrated.    Follow-up with your primary care provider if your symptoms are not improving.

## 2021-03-29 NOTE — ED Provider Notes (Signed)
[name]    CSN: [id number] Arrival date & time: 03/29/21  0801      History   Chief Complaint Chief Complaint  Patient presents with   Sore Throat   Generalized Body Aches   Headache   Otalgia    HPI Jacqueline Vazquez is a 41 y.o. female.  Patient presents with 5-day history of chills, body aches, headache, left ear pain, sore throat, cough.  She has had some diarrhea but has history of IBS; no diarrhea in the last 48 hours.  Treatment at home with ibuprofen, Sudafed, Mucinex; no OTC medications taken today.  No fever, rash, shortness of breath, vomiting, or other symptoms.  Her medical history includes seasonal allergies, IBS, kidney stones, obesity, gestational hypertension.      The history is provided by the patient and medical records.   Past Medical History:  Diagnosis Date   Gestational hypertension    Heartburn in pregnancy    History of depression college   resolved with 2 yrs zoloft   History of kidney stones 2012   Hx of migraines    Irritable bowel syndrome    Pregnancy complication    placenta accreta with hemorrhage with ICU stay, gest HTN    Patient Active Problem List   Diagnosis Date Noted   Urinary frequency 08/26/2020   Kidney stone 08/26/2020   Health maintenance examination 05/13/2019   Obesity, Class II, BMI 35-39.9, no comorbidity 05/13/2019   Hyperglycemia 05/13/2019   Seasonal allergic rhinitis due to pollen 05/16/2017   Lipoma of arm 04/03/2016   Neck swelling 05/12/2013   GAD (generalized anxiety disorder) 01/17/2013   Irritable bowel syndrome     Past Surgical History:  Procedure Laterality Date   CESAREAN SECTION  11/23/2011   Surgeon: Marylynn Pearson, MD; PRIMARY   COLONOSCOPY  2008   IBS, int hemmorhoids, blood with BMs ([place])   DILATION AND CURETTAGE OF UTERUS  2011   miscarriage   ESOPHAGOGASTRODUODENOSCOPY  2008   ([place])   WISDOM TOOTH EXTRACTION      OB History     Gravida  2   Para  1    Term  1   Preterm  0   AB  1   Living  1      SAB  1   IAB  0   Ectopic  0   Multiple  0   Live Births  1            Home Medications    Prior to Admission medications   Medication Sig Start Date End Date Taking? Authorizing Provider  lidocaine (XYLOCAINE) 2 % solution Use as directed 15 mLs in the mouth or throat as needed for mouth pain. 03/29/21  Yes Sharion Balloon, NP  citalopram (CELEXA) 40 MG tablet Take 1 tablet (40 mg total) by mouth daily. 06/22/20   Ria Bush, MD  esomeprazole (NEXIUM) 20 MG capsule Take 20 mg by mouth daily at 12 noon. OTC    [provider]  loratadine (CLARITIN) 10 MG tablet Take 1 tablet (10 mg total) by mouth daily. 06/22/20   Ria Bush, MD  LORazepam (ATIVAN) 0.5 MG tablet TAKE 1/2-1 TABLETS BY MOUTH 2 (TWO) TIMES DAILY AS NEEDED FOR ANXIETY. 11/12/20   Ria Bush, MD    Family History Family History  Problem Relation Age of Onset   Hypertension Father    Diabetes Father    Diabetes Maternal Grandmother    Hyperlipidemia Father  Depression Mother        with anxiety   Cancer Maternal Grandfather        esophageal and lung (smoker)   Heart disease Father        unsure details   Cancer Sister        ovarian/cervical   Stroke Neg Hx     Social History Social History   Tobacco Use   Smoking status: Former    Types: Cigarettes    Quit date: 12/15/2012    Years since quitting: 8.2   Smokeless tobacco: Never  Substance Use Topics   Alcohol use: No    Alcohol/week: 0.0 standard drinks   Drug use: No     Allergies   Percocet [oxycodone-acetaminophen] and Flagyl [metronidazole hcl]   Review of Systems Review of Systems  Constitutional:  Positive for chills. Negative for fever.  HENT:  Positive for congestion, ear pain and sore throat.   Respiratory:  Positive for cough. Negative for shortness of breath.   Cardiovascular:  Negative for chest pain and palpitations.  Gastrointestinal:   Positive for diarrhea. Negative for abdominal pain and vomiting.  Skin:  Negative for color change and rash.  Neurological:  Positive for headaches.  All other systems reviewed and are negative.   Physical Exam Triage Vital Signs ED Triage Vitals  Enc Vitals Group     BP      Pulse      Resp      Temp      Temp src      SpO2      Weight      Height      Head Circumference      Peak Flow      Pain Score      Pain Loc      Pain Edu?      Excl. in [place]?    No data found.  Updated Vital Signs BP 128/88    Pulse (!) 105    Temp 98.2 F (36.8 C)    Resp 18    SpO2 96%   Visual Acuity Right Eye Distance:   Left Eye Distance:   Bilateral Distance:    Right Eye Near:   Left Eye Near:    Bilateral Near:     Physical Exam Vitals and nursing note reviewed.  Constitutional:      General: She is not in acute distress.    Appearance: She is well-developed. She is obese.  HENT:     Right Ear: Tympanic membrane normal.     Left Ear: Tympanic membrane normal.     Nose: Rhinorrhea present.     Mouth/Throat:     Mouth: Mucous membranes are moist.     Pharynx: Posterior oropharyngeal erythema present.  Cardiovascular:     Rate and Rhythm: Normal rate and regular rhythm.     Heart sounds: Normal heart sounds.  Pulmonary:     Effort: Pulmonary effort is normal. No respiratory distress.     Breath sounds: Normal breath sounds.  Musculoskeletal:     Cervical back: Neck supple.  Skin:    General: Skin is warm and dry.  Neurological:     Mental Status: She is alert.  Psychiatric:        Mood and Affect: Mood normal.        Behavior: Behavior normal.     UC Treatments / Results  Labs (all labs ordered are listed, but only abnormal results are displayed) Labs  Reviewed  COVID-19, FLU A+B NAA  POCT RAPID STREP A (OFFICE)    EKG   Radiology No results found.  Procedures Procedures (including critical care time)  Medications Ordered in UC Medications - No data to  display  Initial Impression / Assessment and Plan / UC Course  I have reviewed the triage vital signs and the nursing notes.  Pertinent labs & imaging results that were available during my care of the patient were reviewed by me and considered in my medical decision making (see chart for details).    Viral illness, sore throat.  Rapid strep negative.  COVID and Flu pending.  Instructed patient to self quarantine per CDC guidelines.  Treating sore throat with viscous lidocaine.  Discussed symptomatic treatment including Tylenol or ibuprofen, rest, hydration.  Instructed patient to follow up with PCP if symptoms are not improving.  Patient agrees to plan of care.   Final Clinical Impressions(s) / UC Diagnoses   Final diagnoses:  Viral illness  Sore throat     Discharge Instructions      Your strep test is negative.  Your COVID and Flu tests are pending.  You should self quarantine until the test results are back.    Use the viscous lidocaine as discussed.  Take Tylenol or ibuprofen as needed for fever or discomfort.  Rest and keep yourself hydrated.    Follow-up with your primary care provider if your symptoms are not improving.         ED Prescriptions     Medication Sig Dispense Auth. Provider   lidocaine (XYLOCAINE) 2 % solution Use as directed 15 mLs in the mouth or throat as needed for mouth pain. 100 mL Sharion Balloon, NP      PDMP not reviewed this encounter.   Sharion Balloon, NP 03/29/21 [phone]

## 2021-03-29 NOTE — ED Triage Notes (Signed)
Pt here with sore throat, left ear pain, headache and body aches x 4 days.

## 2021-03-30 LAB — COVID-19, FLU A+B NAA
Influenza A, NAA: NOT DETECTED
Influenza B, NAA: NOT DETECTED
SARS-CoV-2, NAA: NOT DETECTED

## 2021-05-11 DIAGNOSIS — L738 Other specified follicular disorders: Secondary | ICD-10-CM | POA: Diagnosis not present

## 2021-05-11 DIAGNOSIS — Z1283 Encounter for screening for malignant neoplasm of skin: Secondary | ICD-10-CM | POA: Diagnosis not present

## 2021-05-11 DIAGNOSIS — D485 Neoplasm of uncertain behavior of skin: Secondary | ICD-10-CM | POA: Diagnosis not present

## 2021-05-11 DIAGNOSIS — L820 Inflamed seborrheic keratosis: Secondary | ICD-10-CM | POA: Diagnosis not present

## 2021-05-11 DIAGNOSIS — D225 Melanocytic nevi of trunk: Secondary | ICD-10-CM | POA: Diagnosis not present

## 2021-07-14 ENCOUNTER — Other Ambulatory Visit: Payer: Self-pay | Admitting: Family Medicine

## 2021-08-15 ENCOUNTER — Other Ambulatory Visit: Payer: Self-pay | Admitting: Family Medicine

## 2021-08-16 NOTE — Telephone Encounter (Signed)
Name of Medication: Lorazepam Name of Pharmacy: [facility] Last Fill or Written Date and Quantity: 03/16/21, #30 Last Office Visit and Type: 06/22/20, CPE Next Office Visit and Type: 11/08/21, CPE Last Controlled Substance Agreement Date: none Last UDS: none

## 2021-08-16 NOTE — Telephone Encounter (Signed)
ERx 

## 2021-10-18 ENCOUNTER — Other Ambulatory Visit: Payer: Self-pay | Admitting: Family Medicine

## 2021-10-27 ENCOUNTER — Other Ambulatory Visit: Payer: Self-pay | Admitting: Family Medicine

## 2021-10-27 DIAGNOSIS — R739 Hyperglycemia, unspecified: Secondary | ICD-10-CM

## 2021-10-28 NOTE — Addendum Note (Signed)
Addended by: Ria Bush on: 10/28/2021 12:02 AM   Modules accepted: Orders

## 2021-11-01 ENCOUNTER — Other Ambulatory Visit (INDEPENDENT_AMBULATORY_CARE_PROVIDER_SITE_OTHER): Payer: BC Managed Care – PPO

## 2021-11-01 DIAGNOSIS — R739 Hyperglycemia, unspecified: Secondary | ICD-10-CM

## 2021-11-01 LAB — BASIC METABOLIC PANEL
BUN: 16 mg/dL (ref 6–23)
CO2: 28 mEq/L (ref 19–32)
Calcium: 9.1 mg/dL (ref 8.4–10.5)
Chloride: 101 mEq/L (ref 96–112)
Creatinine, Ser: 0.85 mg/dL (ref 0.40–1.20)
GFR: 85.15 mL/min (ref >60.00)
Glucose, Bld: 99 mg/dL (ref 70–99)
Potassium: 4 mEq/L (ref 3.5–5.1)
Sodium: 136 mEq/L (ref 135–145)

## 2021-11-01 LAB — LIPID PANEL
Cholesterol: 178 mg/dL (ref 0–200)
HDL: 54.9 mg/dL (ref >39.00)
LDL Cholesterol: 100 mg/dL — ABNORMAL HIGH (ref 0–99)
NonHDL: 122.68
Total CHOL/HDL Ratio: 3
Triglycerides: 112 mg/dL (ref 0.0–149.0)
VLDL: 22.4 mg/dL (ref 0.0–40.0)

## 2021-11-08 ENCOUNTER — Encounter: Payer: Self-pay | Admitting: Family Medicine

## 2021-11-08 ENCOUNTER — Ambulatory Visit (INDEPENDENT_AMBULATORY_CARE_PROVIDER_SITE_OTHER): Payer: BC Managed Care – PPO | Admitting: Family Medicine

## 2021-11-08 VITALS — BP 134/86 | HR 97 | Temp 97.5°F | Ht 66.0 in | Wt 247.2 lb

## 2021-11-08 DIAGNOSIS — Z0000 Encounter for general adult medical examination without abnormal findings: Secondary | ICD-10-CM

## 2021-11-08 DIAGNOSIS — E559 Vitamin D deficiency, unspecified: Secondary | ICD-10-CM

## 2021-11-08 DIAGNOSIS — E669 Obesity, unspecified: Secondary | ICD-10-CM

## 2021-11-08 DIAGNOSIS — Z23 Encounter for immunization: Secondary | ICD-10-CM | POA: Diagnosis not present

## 2021-11-08 DIAGNOSIS — F411 Generalized anxiety disorder: Secondary | ICD-10-CM | POA: Diagnosis not present

## 2021-11-08 DIAGNOSIS — J301 Allergic rhinitis due to pollen: Secondary | ICD-10-CM

## 2021-11-08 MED ORDER — VITAMIN D3 25 MCG (1000 UT) PO CAPS: 1.0000 | ORAL_CAPSULE | Freq: Every day | ORAL | Status: DC

## 2021-11-08 MED ORDER — CITALOPRAM HYDROBROMIDE 40 MG PO TABS: 40.0000 mg | ORAL_TABLET | Freq: Every day | ORAL | 3 refills | Status: DC

## 2021-11-08 NOTE — Assessment & Plan Note (Signed)
Chronic, stable period on celexa '40mg'$  daily with rare lorazepam PRN - continue this.

## 2021-11-08 NOTE — Assessment & Plan Note (Signed)
Found to have low vit D levels now on daily replacement with 1000 IU daily and notes energy levels have improved - continue this.

## 2021-11-08 NOTE — Assessment & Plan Note (Addendum)
Encouraged healthy diet and lifestyle choices to affect sustainable weight loss. Encouraged she start regular exercise routine (considering walking in am with coworker before work starts).

## 2021-11-08 NOTE — Assessment & Plan Note (Signed)
Preventative protocols reviewed and updated unless pt declined. Discussed healthy diet and lifestyle.  

## 2021-11-08 NOTE — Patient Instructions (Addendum)
Flu shot today  You are doing well today  Work on incorporating walking into routine Return as needed or in 1 year for next physical.   Health Maintenance, Female Adopting a healthy lifestyle and getting preventive care are important in promoting health and wellness. Ask your health care provider about: The right schedule for you to have regular tests and exams. Things you can do on your own to prevent diseases and keep yourself healthy. What should I know about diet, weight, and exercise? Eat a healthy diet  Eat a diet that includes plenty of vegetables, fruits, low-fat dairy products, and lean protein. Do not eat a lot of foods that are high in solid fats, added sugars, or sodium. Maintain a healthy weight Body mass index (BMI) is used to identify weight problems. It estimates body fat based on height and weight. Your health care provider can help determine your BMI and help you achieve or maintain a healthy weight. Get regular exercise Get regular exercise. This is one of the most important things you can do for your health. Most adults should: Exercise for at least 150 minutes each week. The exercise should increase your heart rate and make you sweat (moderate-intensity exercise). Do strengthening exercises at least twice a week. This is in addition to the moderate-intensity exercise. Spend less time sitting. Even light physical activity can be beneficial. Watch cholesterol and blood lipids Have your blood tested for lipids and cholesterol at 41 years of age, then have this test every 5 years. Have your cholesterol levels checked more often if: Your lipid or cholesterol levels are high. You are older than 41 years of age. You are at high risk for heart disease. What should I know about cancer screening? Depending on your health history and family history, you may need to have cancer screening at various ages. This may include screening for: Breast cancer. Cervical  cancer. Colorectal cancer. Skin cancer. Lung cancer. What should I know about heart disease, diabetes, and high blood pressure? Blood pressure and heart disease High blood pressure causes heart disease and increases the risk of stroke. This is more likely to develop in people who have high blood pressure readings or are overweight. Have your blood pressure checked: Every 3-5 years if you are 34-25 years of age. Every year if you are 36 years old or older. Diabetes Have regular diabetes screenings. This checks your fasting blood sugar level. Have the screening done: Once every three years after age 33 if you are at a normal weight and have a low risk for diabetes. More often and at a younger age if you are overweight or have a high risk for diabetes. What should I know about preventing infection? Hepatitis B If you have a higher risk for hepatitis B, you should be screened for this virus. Talk with your health care provider to find out if you are at risk for hepatitis B infection. Hepatitis C Testing is recommended for: Everyone born from [birth year] through 1965. Anyone with known risk factors for hepatitis C. Sexually transmitted infections (STIs) Get screened for STIs, including gonorrhea and chlamydia, if: You are sexually active and are younger than 41 years of age. You are older than 41 years of age and your health care provider tells you that you are at risk for this type of infection. Your sexual activity has changed since you were last screened, and you are at increased risk for chlamydia or gonorrhea. Ask your health care provider if you are at  risk. Ask your health care provider about whether you are at high risk for HIV. Your health care provider may recommend a prescription medicine to help prevent HIV infection. If you choose to take medicine to prevent HIV, you should first get tested for HIV. You should then be tested every 3 months for as long as you are taking the  medicine. Pregnancy If you are about to stop having your period (premenopausal) and you may become pregnant, seek counseling before you get pregnant. Take 400 to 800 micrograms (mcg) of folic acid every day if you become pregnant. Ask for birth control (contraception) if you want to prevent pregnancy. Osteoporosis and menopause Osteoporosis is a disease in which the bones lose minerals and strength with aging. This can result in bone fractures. If you are 31 years old or older, or if you are at risk for osteoporosis and fractures, ask your health care provider if you should: Be screened for bone loss. Take a calcium or vitamin D supplement to lower your risk of fractures. Be given hormone replacement therapy (HRT) to treat symptoms of menopause. Follow these instructions at home: Alcohol use Do not drink alcohol if: Your health care provider tells you not to drink. You are pregnant, may be pregnant, or are planning to become pregnant. If you drink alcohol: Limit how much you have to: 0-1 drink a day. Know how much alcohol is in your drink. In the [place] one drink equals one 12 oz bottle of beer (355 mL), one 5 oz glass of wine (148 mL), or one 1 oz glass of hard liquor (44 mL). Lifestyle Do not use any products that contain nicotine or tobacco. These products include cigarettes, chewing tobacco, and vaping devices, such as e-cigarettes. If you need help quitting, ask your health care provider. Do not use street drugs. Do not share needles. Ask your health care provider for help if you need support or information about quitting drugs. General instructions Schedule regular health, dental, and eye exams. Stay current with your vaccines. Tell your health care provider if: You often feel depressed. You have ever been abused or do not feel safe at home. Summary Adopting a healthy lifestyle and getting preventive care are important in promoting health and wellness. Follow your health care  provider's instructions about healthy diet, exercising, and getting tested or screened for diseases. Follow your health care provider's instructions on monitoring your cholesterol and blood pressure. This information is not intended to replace advice given to you by your health care provider. Make sure you discuss any questions you have with your health care provider. Document Revised: 06/14/2020 Document Reviewed: 06/14/2020 Elsevier Patient Education  [place].

## 2021-11-08 NOTE — Assessment & Plan Note (Signed)
Discussed changing antihistamine brands as she notices possible decreased effectiveness of claritin and has been on this for years.

## 2021-11-08 NOTE — Progress Notes (Signed)
Patient ID: Jacqueline Vazquez, female    DOB: [birth year], 41 y.o.   MRN: [id number]  This visit was conducted in person.  BP 134/86   Pulse 97   Temp (!) 97.5 F (36.4 C) (Temporal)   Ht '5\' 6"'$  (1.676 m)   Wt 247 lb 4 oz (112.2 kg)   LMP 11/03/2021   SpO2 96%   BMI 39.91 kg/m    CC: CPE Subjective:   HPI: Jacqueline Vazquez is a 41 y.o. female presenting on 11/08/2021 for Annual Exam   Found to have low vit D s/p replacement which has helped fatigue.   Preventative: Well woman exam - with OBGYN Dr Orvan Seen at [facility]. Upcoming appt 11/2021.  Mammo 10/2020 - US showed L breast cyst - planned rpt through GYN LMP 11/03/2021, regular  Flu shot yearly  COVID vaccine 05/2019, 06/2019, booster 01/2020 Tdap 2013  Seat belt use discussed  Sunscreen use discussed. Saw dermatology 6 months ago - abnormal moles but no cancerous - upcoming 6 mo check.  Ex smoker - quit 2014, previously 12 PY hx. Husband smokes outdoors.  Alcohol - 1-2 glasses of wine seldom  Dentist q6 mo  Eye exam - due    Lives with husband and daughter, 1 dog. Mother Luiz Blare) recently moved in with them. Occupation: Location manager  Edu: college  Activity: no regular exercise  Diet: drinking more water, fruits/vegetables regularly, cooking at home     Relevant past medical, surgical, family and social history reviewed and updated as indicated. Interim medical history since our last visit reviewed. Allergies and medications reviewed and updated. Outpatient Medications Prior to Visit  Medication Sig Dispense Refill   esomeprazole (NEXIUM) 20 MG capsule Take 20 mg by mouth daily at 12 noon. OTC     loratadine (CLARITIN) 10 MG tablet TAKE 1 TABLET BY MOUTH EVERY DAY 30 tablet 0   LORazepam (ATIVAN) 0.5 MG tablet TAKE 1/2-1 TABLETS BY MOUTH 2 (TWO) TIMES DAILY AS NEEDED FOR ANXIETY. 30 tablet 0   citalopram (CELEXA) 40 MG tablet TAKE 1 TABLET BY MOUTH EVERY DAY 30 tablet 0   lidocaine  (XYLOCAINE) 2 % solution Use as directed 15 mLs in the mouth or throat as needed for mouth pain. 100 mL 0   No facility-administered medications prior to visit.     Per HPI unless specifically indicated in ROS section below Review of Systems  Constitutional:  Negative for activity change, appetite change, chills, fatigue, fever and unexpected weight change.  HENT:  Negative for hearing loss.   Eyes:  Negative for visual disturbance.  Respiratory:  Negative for cough, chest tightness, shortness of breath and wheezing.   Cardiovascular:  Negative for chest pain, palpitations and leg swelling.  Gastrointestinal:  Negative for abdominal distention, abdominal pain, blood in stool, constipation, diarrhea, nausea and vomiting.  Genitourinary:  Negative for difficulty urinating and hematuria.  Musculoskeletal:  Negative for arthralgias, myalgias and neck pain.  Skin:  Negative for rash.  Neurological:  Negative for dizziness, seizures, syncope and headaches.  Hematological:  Negative for adenopathy. Does not bruise/bleed easily.  Psychiatric/Behavioral:  Negative for dysphoric mood. The patient is not nervous/anxious.     Objective:  BP 134/86   Pulse 97   Temp (!) 97.5 F (36.4 C) (Temporal)   Ht '5\' 6"'$  (1.676 m)   Wt 247 lb 4 oz (112.2 kg)   LMP 11/03/2021   SpO2 96%   BMI 39.91 kg/m  Wt Readings from Last 3 Encounters:  11/08/21 247 lb 4 oz (112.2 kg)  08/26/20 250 lb 8 oz (113.6 kg)  06/22/20 249 lb 4 oz (113.1 kg)      Physical Exam Vitals and nursing note reviewed.  Constitutional:      Appearance: Normal appearance. She is not ill-appearing.  HENT:     Head: Normocephalic and atraumatic.     Right Ear: Tympanic membrane, ear canal and external ear normal. There is no impacted cerumen.     Left Ear: Tympanic membrane, ear canal and external ear normal. There is no impacted cerumen.  Eyes:     General:        Right eye: No discharge.        Left eye: No discharge.      Extraocular Movements: Extraocular movements intact.     Conjunctiva/sclera: Conjunctivae normal.     Pupils: Pupils are equal, round, and reactive to light.  Neck:     Thyroid: No thyroid mass or thyromegaly.  Cardiovascular:     Rate and Rhythm: Normal rate and regular rhythm.     Pulses: Normal pulses.     Heart sounds: Normal heart sounds. No murmur heard. Pulmonary:     Effort: Pulmonary effort is normal. No respiratory distress.     Breath sounds: Normal breath sounds. No wheezing, rhonchi or rales.  Abdominal:     General: Bowel sounds are normal. There is no distension.     Palpations: Abdomen is soft. There is no mass.     Tenderness: There is no abdominal tenderness. There is no guarding or rebound.     Hernia: No hernia is present.  Musculoskeletal:     Cervical back: Normal range of motion and neck supple. No rigidity.     Right lower leg: No edema.     Left lower leg: No edema.  Lymphadenopathy:     Cervical: No cervical adenopathy.  Skin:    General: Skin is warm and dry.     Findings: No rash.  Neurological:     General: No focal deficit present.     Mental Status: She is alert. Mental status is at baseline.  Psychiatric:        Mood and Affect: Mood normal.        Behavior: Behavior normal.       Results for orders placed or performed in visit on 11/01/21  Lipid panel  Result Value Ref Range   Cholesterol 178 0 - 200 mg/dL   Triglycerides 112.0 0.0 - 149.0 mg/dL   HDL 54.90 >39.00 mg/dL   VLDL 22.4 0.0 - 40.0 mg/dL   LDL Cholesterol 100 (H) 0 - 99 mg/dL   Total CHOL/HDL Ratio 3    NonHDL 979.89   Basic metabolic panel  Result Value Ref Range   Sodium 136 135 - 145 mEq/L   Potassium 4.0 3.5 - 5.1 mEq/L   Chloride 101 96 - 112 mEq/L   CO2 28 19 - 32 mEq/L   Glucose, Bld 99 70 - 99 mg/dL   BUN 16 6 - 23 mg/dL   Creatinine, Ser 0.85 0.40 - 1.20 mg/dL   GFR 85.15 >60.00 mL/min   Calcium 9.1 8.4 - 10.5 mg/dL      11/08/2021   11:11 AM 06/22/2020     9:56 AM 05/13/2019    9:02 AM 12/16/2018    4:06 PM 05/16/2017    1:44 PM  Depression screen PHQ 2/9  Decreased Interest 0  0 1 1 0  Down, Depressed, Hopeless 0 0  1 0  PHQ - 2 Score 0 0 1 2 0  Altered sleeping 0 '1 1 1 1  '$ Tired, decreased energy '1 1 1 1 1  '$ Change in appetite 0 2 0 1 1  Feeling bad or failure about yourself  0 0 0 0 0  Trouble concentrating 0 0 0 0 0  Moving slowly or fidgety/restless 0 0 0 0 0  Suicidal thoughts 0 0 0 0 0  PHQ-9 Score '1 4 3 5 3  '$ Difficult doing work/chores Not difficult at all           11/08/2021   11:11 AM 06/22/2020    9:57 AM 05/13/2019    9:03 AM 12/16/2018    4:06 PM  GAD 7 : Generalized Anxiety Score  Nervous, Anxious, on Edge '1 1 1 2  '$ Control/stop worrying '1 1 1 2  '$ Worry too much - different things '1 1 1 2  '$ Trouble relaxing '1 2 1 2  '$ Restless 0 1 0 2  Easily annoyed or irritable '1 1 1 '$ 0  Afraid - awful might happen 0 1 0 2  Total GAD 7 Score '5 8 5 12  '$ Anxiety Difficulty Somewhat difficult      Assessment & Plan:   Problem List Items Addressed This Visit     Health maintenance examination - Primary (Chronic)    Preventative protocols reviewed and updated unless pt declined. Discussed healthy diet and lifestyle.       GAD (generalized anxiety disorder)    Chronic, stable period on celexa '40mg'$  daily with rare lorazepam PRN - continue this.       Relevant Medications   citalopram (CELEXA) 40 MG tablet   Seasonal allergic rhinitis due to pollen    Discussed changing antihistamine brands as she notices possible decreased effectiveness of claritin and has been on this for years.      Obesity, Class II, BMI 35-39.9, no comorbidity    Encouraged healthy diet and lifestyle choices to affect sustainable weight loss. Encouraged she start regular exercise routine (considering walking in am with coworker before work starts).       Vitamin D deficiency    Found to have low vit D levels now on daily replacement with 1000 IU daily and notes  energy levels have improved - continue this.       Other Visit Diagnoses     Need for influenza vaccination       Relevant Orders   Flu Vaccine QUAD 27moIM (Fluarix, Fluzone & Alfiuria Quad PF) (Completed)        Meds ordered this encounter  Medications   Cholecalciferol (VITAMIN D3) 25 MCG (1000 UT) CAPS    Sig: Take 1 capsule (1,000 Units total) by mouth daily.    Dispense:  30 capsule   citalopram (CELEXA) 40 MG tablet    Sig: Take 1 tablet (40 mg total) by mouth daily.    Dispense:  90 tablet    Refill:  3   Orders Placed This Encounter  Procedures   Flu Vaccine QUAD 619moM (Fluarix, Fluzone & Alfiuria Quad PF)    Patient instructions: Flu shot today  You are doing well today  Work on incorporating walking into routine Return as needed or in 1 year for next physical.   Follow up plan: Return in about 1 year (around 11/09/2022) for annual exam, prior fasting for blood work.  JaRia BushMD

## 2021-11-09 DIAGNOSIS — D485 Neoplasm of uncertain behavior of skin: Secondary | ICD-10-CM | POA: Diagnosis not present

## 2021-11-09 DIAGNOSIS — D225 Melanocytic nevi of trunk: Secondary | ICD-10-CM | POA: Diagnosis not present

## 2021-11-21 ENCOUNTER — Other Ambulatory Visit: Payer: Self-pay | Admitting: Family Medicine

## 2021-11-22 DIAGNOSIS — Z124 Encounter for screening for malignant neoplasm of cervix: Secondary | ICD-10-CM | POA: Diagnosis not present

## 2021-11-22 DIAGNOSIS — Z1231 Encounter for screening mammogram for malignant neoplasm of breast: Secondary | ICD-10-CM | POA: Diagnosis not present

## 2021-11-22 DIAGNOSIS — Z01419 Encounter for gynecological examination (general) (routine) without abnormal findings: Secondary | ICD-10-CM | POA: Diagnosis not present

## 2021-11-22 DIAGNOSIS — N926 Irregular menstruation, unspecified: Secondary | ICD-10-CM | POA: Diagnosis not present

## 2021-11-22 DIAGNOSIS — Z1151 Encounter for screening for human papillomavirus (HPV): Secondary | ICD-10-CM | POA: Diagnosis not present

## 2021-11-22 DIAGNOSIS — Z6838 Body mass index (BMI) 38.0-38.9, adult: Secondary | ICD-10-CM | POA: Diagnosis not present

## 2021-11-28 DIAGNOSIS — D485 Neoplasm of uncertain behavior of skin: Secondary | ICD-10-CM | POA: Diagnosis not present

## 2021-11-29 DIAGNOSIS — D225 Melanocytic nevi of trunk: Secondary | ICD-10-CM | POA: Diagnosis not present

## 2021-12-07 ENCOUNTER — Other Ambulatory Visit: Payer: Self-pay | Admitting: Family Medicine

## 2021-12-08 DIAGNOSIS — N3001 Acute cystitis with hematuria: Secondary | ICD-10-CM | POA: Diagnosis not present

## 2021-12-08 DIAGNOSIS — R300 Dysuria: Secondary | ICD-10-CM | POA: Diagnosis not present

## 2021-12-08 DIAGNOSIS — T3695XA Adverse effect of unspecified systemic antibiotic, initial encounter: Secondary | ICD-10-CM | POA: Diagnosis not present

## 2021-12-08 DIAGNOSIS — B379 Candidiasis, unspecified: Secondary | ICD-10-CM | POA: Diagnosis not present

## 2021-12-08 NOTE — Telephone Encounter (Signed)
ERx 

## 2021-12-08 NOTE — Telephone Encounter (Signed)
Name of Medication: Lorazepam Name of Pharmacy: [facility] Last Fill or Written Date and Quantity: 08/16/21, #30 Last Office Visit and Type: 11/08/21, CPE Next Office Visit and Type: none Last Controlled Substance Agreement Date: none Last UDS: none

## 2022-01-03 ENCOUNTER — Ambulatory Visit
Admission: EM | Admit: 2022-01-03 | Discharge: 2022-01-03 | Disposition: A | Discharge status: Home / Self Care | Payer: BC Managed Care – PPO

## 2022-01-03 DIAGNOSIS — R8290 Unspecified abnormal findings in urine: Secondary | ICD-10-CM

## 2022-01-03 DIAGNOSIS — Z3202 Encounter for pregnancy test, result negative: Secondary | ICD-10-CM

## 2022-01-03 LAB — POCT URINALYSIS DIP (MANUAL ENTRY)
Bilirubin, UA: NEGATIVE
Blood, UA: NEGATIVE
Glucose, UA: NEGATIVE mg/dL
Ketones, POC UA: NEGATIVE mg/dL
Leukocytes, UA: NEGATIVE
Nitrite, UA: NEGATIVE
Protein Ur, POC: NEGATIVE mg/dL
Spec Grav, UA: 1.015 (ref 1.010–1.025)
Urobilinogen, UA: 0.2 E.U./dL
pH, UA: 7 (ref 5.0–8.0)

## 2022-01-03 LAB — POCT URINE PREGNANCY: Preg Test, Ur: NEGATIVE

## 2022-01-03 NOTE — ED Triage Notes (Signed)
Pt states that 1 month ago she had a bladder infection which resolved with antibiotics. Pt states this am her urine is cloudy, darker than normal and has an odor. Pt reports feeling achy.

## 2022-01-03 NOTE — ED Provider Notes (Signed)
[name]    CSN: [id number] Arrival date & time: 01/03/22  0935      History   Chief Complaint Chief Complaint  Patient presents with   Urinary Frequency    HPI Jacqueline Vazquez is a 41 y.o. female.  Patient presents with cloudy malodorous urine today.  She denies dysuria, hematuria, fever, abdominal pain, flank pain, vaginal discharge, pelvic pain, or other symptoms.  She is concerned for possible recurrent UTI.  Patient was seen at [facility] on 12/08/2021; diagnosed with acute cystitis, dysuria, yeast infection; treated with cephalexin and Diflucan.  Her symptoms resolved with this treatment.  The history is provided by the patient and medical records.    Past Medical History:  Diagnosis Date   Gestational hypertension    Heartburn in pregnancy    History of depression college   resolved with 2 yrs zoloft   History of kidney stones 2012   Hx of migraines    Irritable bowel syndrome    Pregnancy complication    placenta accreta with hemorrhage with ICU stay, gest HTN    Patient Active Problem List   Diagnosis Date Noted   Vitamin D deficiency 11/08/2021   Urinary frequency 08/26/2020   Kidney stone 08/26/2020   Health maintenance examination 05/13/2019   Obesity, Class II, BMI 35-39.9, no comorbidity 05/13/2019   Hyperglycemia 05/13/2019   Seasonal allergic rhinitis due to pollen 05/16/2017   Lipoma of arm 04/03/2016   Neck swelling 05/12/2013   GAD (generalized anxiety disorder) 01/17/2013   Irritable bowel syndrome     Past Surgical History:  Procedure Laterality Date   CESAREAN SECTION  11/23/2011   Surgeon: Marylynn Pearson, MD; PRIMARY   COLONOSCOPY  2008   IBS, int hemmorhoids, blood with BMs ([place])   DILATION AND CURETTAGE OF UTERUS  2011   miscarriage   ESOPHAGOGASTRODUODENOSCOPY  2008   ([place])   WISDOM TOOTH EXTRACTION      OB History     Gravida  2   Para  1   Term  1   Preterm  0   AB  1   Living  1       SAB  1   IAB  0   Ectopic  0   Multiple  0   Live Births  1            Home Medications    Prior to Admission medications   Medication Sig Start Date End Date Taking? Authorizing Provider  citalopram (CELEXA) 40 MG tablet Take 1 tablet (40 mg total) by mouth daily. 11/08/21  Yes Ria Bush, MD  esomeprazole (NEXIUM) 20 MG capsule Take 20 mg by mouth daily at 12 noon. OTC   Yes [provider]  Cholecalciferol (VITAMIN D3) 25 MCG (1000 UT) CAPS Take 1 capsule (1,000 Units total) by mouth daily. 11/08/21   Ria Bush, MD  fexofenadine [facility] ALLERGY) 60 MG tablet Take 60 mg by mouth daily.    [provider]  LORazepam (ATIVAN) 0.5 MG tablet TAKE 1/2-1 TABLETS BY MOUTH 2 (TWO) TIMES DAILY AS NEEDED FOR ANXIETY. 12/08/21   Ria Bush, MD    Family History Family History  Problem Relation Age of Onset   Depression Mother        with anxiety   Hypertension Father    Diabetes Father    Hyperlipidemia Father    Heart disease Father        CHF   Cancer Sister 42  cervical - mid 20s   Diabetes Maternal Grandmother    Cancer Maternal Grandfather        esophageal and lung (smoker)   Stroke Neg Hx     Social History Social History   Tobacco Use   Smoking status: Former    Types: Cigarettes    Quit date: 12/15/2012    Years since quitting: 9.0   Smokeless tobacco: Never  Vaping Use   Vaping Use: Never used  Substance Use Topics   Alcohol use: No    Alcohol/week: 0.0 standard drinks of alcohol   Drug use: No     Allergies   Percocet [oxycodone-acetaminophen] and Flagyl [metronidazole hcl]   Review of Systems Review of Systems  Constitutional:  Negative for chills and fever.  Gastrointestinal:  Negative for abdominal pain, diarrhea, nausea and vomiting.  Genitourinary:  Negative for dysuria, flank pain, hematuria, pelvic pain and vaginal discharge.       Cloudy malodorous urine  Skin:  Negative for color change  and rash.  All other systems reviewed and are negative.    Physical Exam Triage Vital Signs ED Triage Vitals  Enc Vitals Group     BP 01/03/22 1037 129/88     Pulse Rate 01/03/22 1034 100     Resp 01/03/22 1034 18     Temp 01/03/22 1034 98.2 F (36.8 C)     Temp src --      SpO2 01/03/22 1034 97 %     Weight --      Height --      Head Circumference --      Peak Flow --      Pain Score 01/03/22 1037 2     Pain Loc --      Pain Edu? --      Excl. in [place]? --    No data found.  Updated Vital Signs BP 129/88   Pulse 100   Temp 98.2 F (36.8 C)   Resp 18   LMP 12/26/2021   SpO2 97%   Visual Acuity Right Eye Distance:   Left Eye Distance:   Bilateral Distance:    Right Eye Near:   Left Eye Near:    Bilateral Near:     Physical Exam Vitals and nursing note reviewed.  Constitutional:      General: She is not in acute distress.    Appearance: She is well-developed. She is not ill-appearing.  HENT:     Head: Atraumatic.     Mouth/Throat:     Mouth: Mucous membranes are moist.  Cardiovascular:     Rate and Rhythm: Normal rate and regular rhythm.     Heart sounds: Normal heart sounds.  Pulmonary:     Effort: Pulmonary effort is normal. No respiratory distress.     Breath sounds: Normal breath sounds.  Abdominal:     General: Bowel sounds are normal.     Palpations: Abdomen is soft.     Tenderness: There is no abdominal tenderness. There is no right CVA tenderness, left CVA tenderness, guarding or rebound.  Musculoskeletal:     Cervical back: Neck supple.  Skin:    General: Skin is warm and dry.  Neurological:     Mental Status: She is alert.  Psychiatric:        Mood and Affect: Mood normal.        Behavior: Behavior normal.      UC Treatments / Results  Labs (all labs ordered are listed, but  only abnormal results are displayed) Labs Reviewed  POCT URINALYSIS DIP (MANUAL ENTRY)  POCT URINE PREGNANCY    EKG   Radiology No results  found.  Procedures Procedures (including critical care time)  Medications Ordered in UC Medications - No data to display  Initial Impression / Assessment and Plan / UC Course  I have reviewed the triage vital signs and the nursing notes.  Pertinent labs & imaging results that were available during my care of the patient were reviewed by me and considered in my medical decision making (see chart for details).    Malodorous urine.  Urine normal. Urine pregnancy negative.  Discussed continued good water intake.  Instructed patient to follow up with her PCP if her symptoms are not improving.  She agrees to plan of care.    Final Clinical Impressions(s) / UC Diagnoses   Final diagnoses:  Malodorous urine     Discharge Instructions      Your urine is normal today. Continue good water intake.  Follow up with your primary care provider if your symptoms are not improving.        ED Prescriptions   None    PDMP not reviewed this encounter.   Sharion Balloon, NP 01/03/22 [phone]

## 2022-01-03 NOTE — Discharge Instructions (Addendum)
Your urine is normal today. Continue good water intake.  Follow up with your primary care provider if your symptoms are not improving.

## 2022-01-07 DIAGNOSIS — N390 Urinary tract infection, site not specified: Secondary | ICD-10-CM | POA: Diagnosis not present

## 2022-01-07 DIAGNOSIS — R300 Dysuria: Secondary | ICD-10-CM | POA: Diagnosis not present

## 2022-04-05 DIAGNOSIS — Z1283 Encounter for screening for malignant neoplasm of skin: Secondary | ICD-10-CM | POA: Diagnosis not present

## 2022-04-05 DIAGNOSIS — D225 Melanocytic nevi of trunk: Secondary | ICD-10-CM | POA: Diagnosis not present

## 2022-08-03 DIAGNOSIS — B379 Candidiasis, unspecified: Secondary | ICD-10-CM | POA: Diagnosis not present

## 2022-08-03 DIAGNOSIS — R8290 Unspecified abnormal findings in urine: Secondary | ICD-10-CM | POA: Diagnosis not present

## 2022-08-03 DIAGNOSIS — N3001 Acute cystitis with hematuria: Secondary | ICD-10-CM | POA: Diagnosis not present

## 2022-08-03 DIAGNOSIS — T3695XA Adverse effect of unspecified systemic antibiotic, initial encounter: Secondary | ICD-10-CM | POA: Diagnosis not present

## 2022-09-27 ENCOUNTER — Other Ambulatory Visit: Payer: Self-pay | Admitting: Family Medicine

## 2022-09-29 NOTE — Telephone Encounter (Signed)
ERx 

## 2022-10-04 DIAGNOSIS — D225 Melanocytic nevi of trunk: Secondary | ICD-10-CM | POA: Diagnosis not present

## 2022-10-04 DIAGNOSIS — D485 Neoplasm of uncertain behavior of skin: Secondary | ICD-10-CM | POA: Diagnosis not present

## 2022-10-04 DIAGNOSIS — D2271 Melanocytic nevi of right lower limb, including hip: Secondary | ICD-10-CM | POA: Diagnosis not present

## 2022-10-04 DIAGNOSIS — L710 Perioral dermatitis: Secondary | ICD-10-CM | POA: Diagnosis not present

## 2022-12-04 DIAGNOSIS — M67813 Other specified disorders of tendon, right shoulder: Secondary | ICD-10-CM | POA: Diagnosis not present

## 2022-12-04 DIAGNOSIS — M5412 Radiculopathy, cervical region: Secondary | ICD-10-CM | POA: Diagnosis not present

## 2022-12-05 ENCOUNTER — Other Ambulatory Visit: Payer: Self-pay | Admitting: Family Medicine

## 2022-12-05 DIAGNOSIS — F411 Generalized anxiety disorder: Secondary | ICD-10-CM

## 2022-12-07 NOTE — Telephone Encounter (Signed)
Patient called in to follow up on this refill. She stated that she gets sick without it.

## 2022-12-07 NOTE — Telephone Encounter (Signed)
E-scribed refill.   Spoke with pt notifying her refill sent in. Pt expresses her thanks.

## 2022-12-12 DIAGNOSIS — Z13220 Encounter for screening for lipoid disorders: Secondary | ICD-10-CM | POA: Diagnosis not present

## 2022-12-12 DIAGNOSIS — Z124 Encounter for screening for malignant neoplasm of cervix: Secondary | ICD-10-CM | POA: Diagnosis not present

## 2022-12-12 DIAGNOSIS — Z6839 Body mass index (BMI) 39.0-39.9, adult: Secondary | ICD-10-CM | POA: Diagnosis not present

## 2022-12-12 DIAGNOSIS — N926 Irregular menstruation, unspecified: Secondary | ICD-10-CM | POA: Diagnosis not present

## 2022-12-12 DIAGNOSIS — Z1151 Encounter for screening for human papillomavirus (HPV): Secondary | ICD-10-CM | POA: Diagnosis not present

## 2022-12-12 DIAGNOSIS — Z131 Encounter for screening for diabetes mellitus: Secondary | ICD-10-CM | POA: Diagnosis not present

## 2022-12-12 DIAGNOSIS — Z1329 Encounter for screening for other suspected endocrine disorder: Secondary | ICD-10-CM | POA: Diagnosis not present

## 2022-12-12 DIAGNOSIS — Z01419 Encounter for gynecological examination (general) (routine) without abnormal findings: Secondary | ICD-10-CM | POA: Diagnosis not present

## 2022-12-12 DIAGNOSIS — Z1231 Encounter for screening mammogram for malignant neoplasm of breast: Secondary | ICD-10-CM | POA: Diagnosis not present

## 2022-12-12 DIAGNOSIS — Z1321 Encounter for screening for nutritional disorder: Secondary | ICD-10-CM | POA: Diagnosis not present

## 2022-12-12 DIAGNOSIS — Z13228 Encounter for screening for other metabolic disorders: Secondary | ICD-10-CM | POA: Diagnosis not present

## 2022-12-25 DIAGNOSIS — M25511 Pain in right shoulder: Secondary | ICD-10-CM | POA: Diagnosis not present

## 2022-12-25 DIAGNOSIS — M5412 Radiculopathy, cervical region: Secondary | ICD-10-CM | POA: Diagnosis not present

## 2022-12-25 DIAGNOSIS — N915 Oligomenorrhea, unspecified: Secondary | ICD-10-CM | POA: Diagnosis not present

## 2023-03-07 ENCOUNTER — Other Ambulatory Visit: Payer: Self-pay | Admitting: Family Medicine

## 2023-03-07 DIAGNOSIS — F411 Generalized anxiety disorder: Secondary | ICD-10-CM

## 2023-03-08 NOTE — Telephone Encounter (Signed)
ERx

## 2023-03-08 NOTE — Telephone Encounter (Signed)
Name of Medication: Lorazepam Name of Pharmacy: [facility] Last Fill or Written Date and Quantity: 09/29/22, #30 Last Office Visit and Type: 11/08/21, CPE Next Office Visit and Type: none Last Controlled Substance Agreement Date: none Last UDS: none

## 2023-05-31 ENCOUNTER — Other Ambulatory Visit: Payer: Self-pay | Admitting: Family Medicine

## 2023-05-31 DIAGNOSIS — F411 Generalized anxiety disorder: Secondary | ICD-10-CM

## 2023-05-31 NOTE — Telephone Encounter (Signed)
 Name of Medication: Lorazepam  Name of Pharmacy: [facility] Last Fill or Written Date and Quantity: 1/31, #30 Last Office Visit and Type: 11/08/21, CPE Next Office Visit and Type: none Last Controlled Substance Agreement Date: none Last UDS: none

## 2023-06-01 NOTE — Telephone Encounter (Signed)
 ERx

## 2023-06-18 IMAGING — US US BREAST*L* LIMITED INC AXILLA
1 series · 6 of 6 positions shown · non-contrast
Comparison: Baseline screening mammogram dated 09/21/2020.

CLINICAL DATA: Possible mass in the anterior left breast medially
on a recent baseline screening mammogram.

EXAM:
ULTRASOUND OF THE LEFT BREAST

[Series 1: us breast*left* limited inc axilla · 0.07mm/px · 6 of 6 slices shown]
[im 1/6]
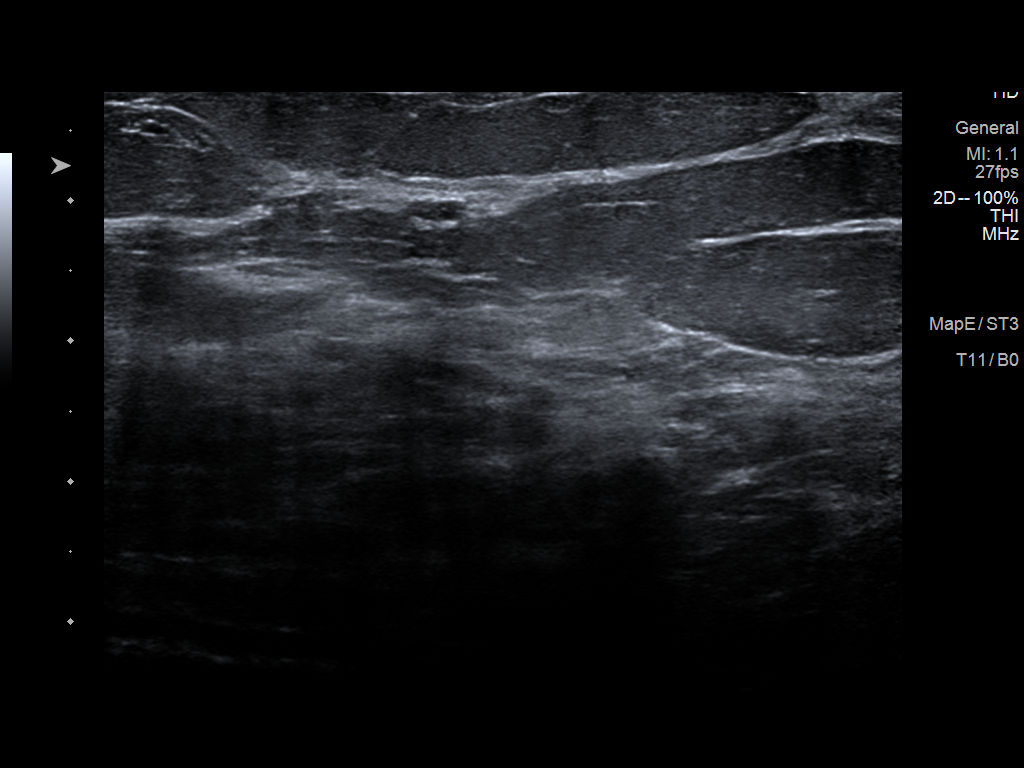
[im 2/6]
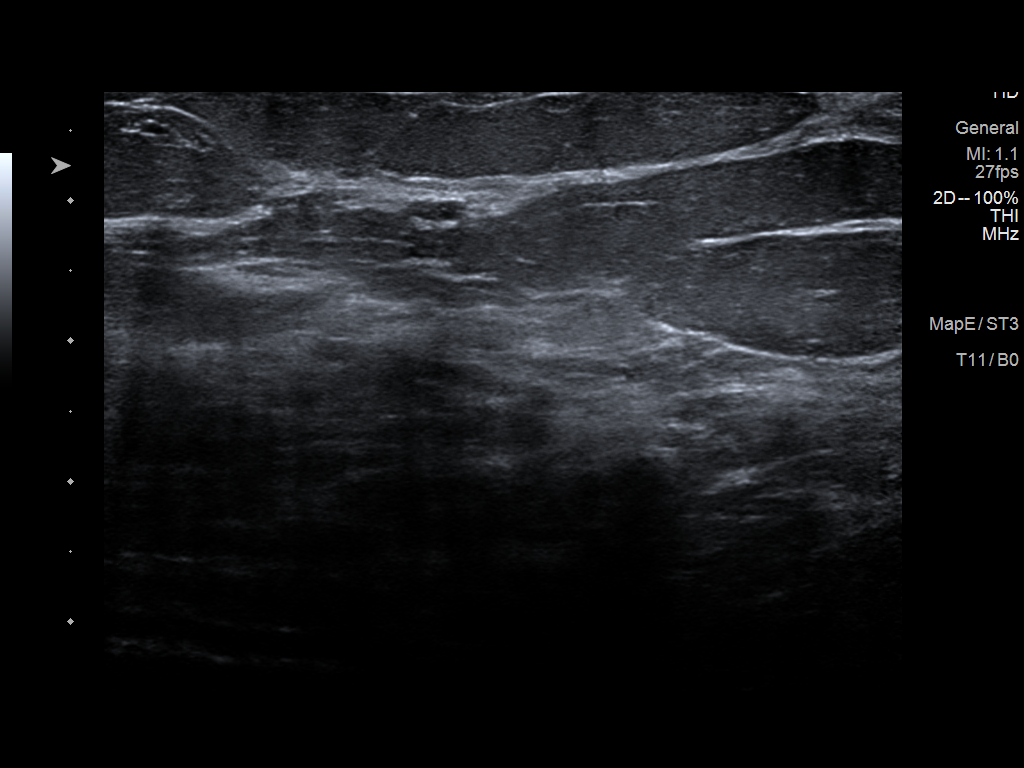
[im 3/6]
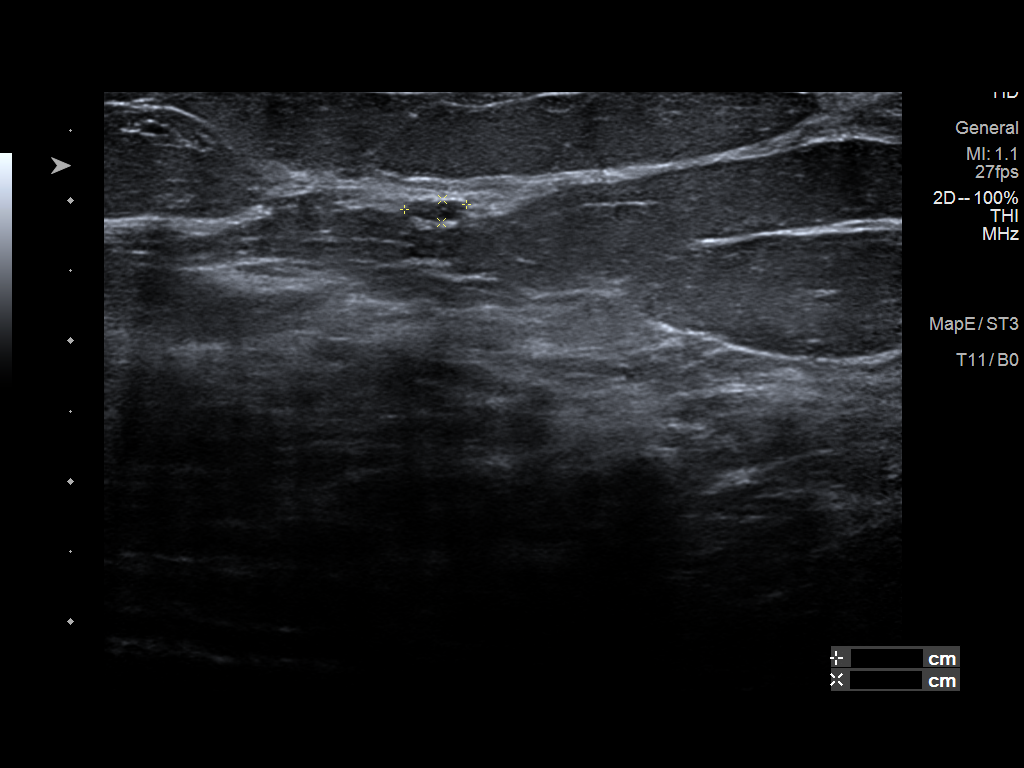
[im 4/6]
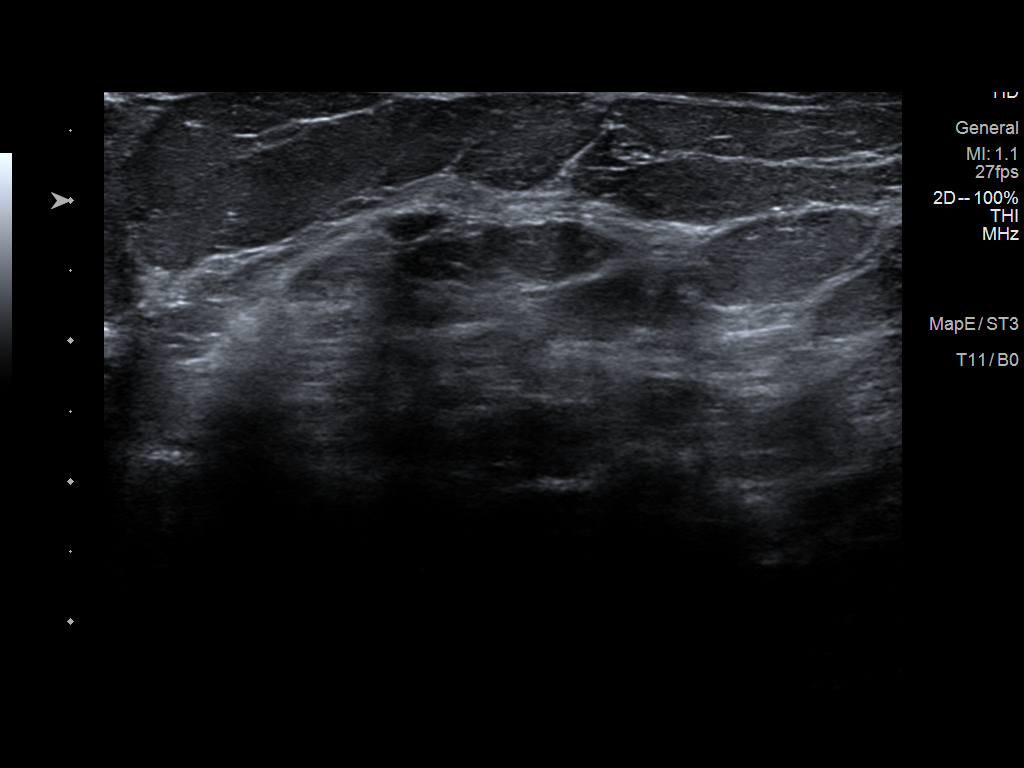
[im 5/6]
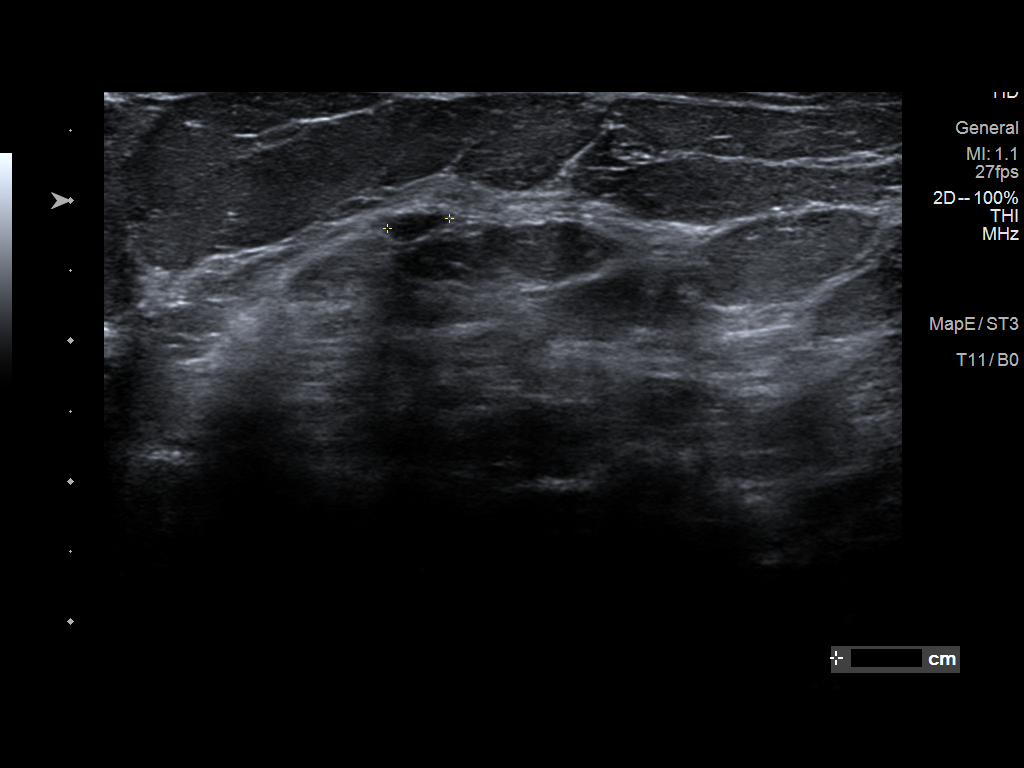
[im 6/6]
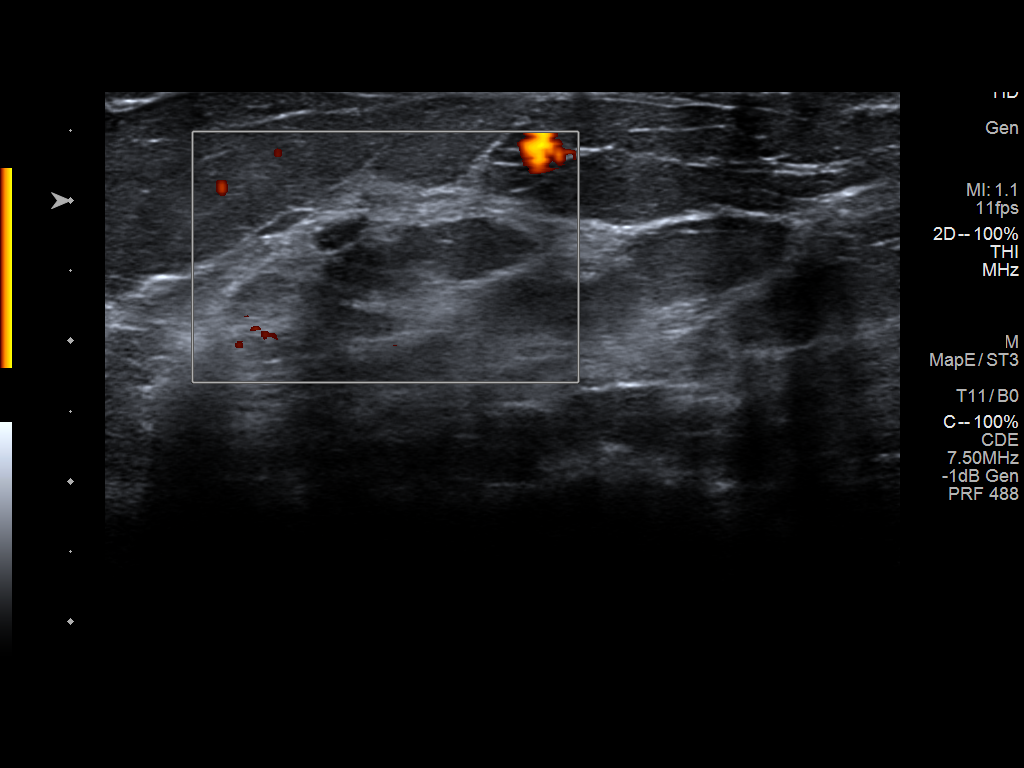

[6 of 6 positions shown; findings below may reference images not displayed]

FINDINGS: On physical exam, no mass is palpable in the medial left breast.

Targeted ultrasound is performed, showing a 5 mm oval,
circumscribed, hypoechoic mass in the 10:30 o'clock position of the
left breast, 3 cm from the nipple. No internal blood flow was seen
with power Doppler. There were also mildly dilated retroareolar
ducts.
IMPRESSION: 1. 5 mm benign, mildly complicated cyst in the 10:30 o'clock
position of the left breast.
2. Mild benign left breast duct ectasia.
3. No evidence of malignancy.

RECOMMENDATION:
Bilateral screening mammogram in 1 year when due.

I have discussed the findings and recommendations with the patient.
If applicable, a reminder letter will be sent to the patient
regarding the next appointment.

BI-RADS CATEGORY  2: Benign.

## 2023-11-18 ENCOUNTER — Other Ambulatory Visit: Payer: Self-pay | Admitting: Family Medicine

## 2023-11-18 DIAGNOSIS — E559 Vitamin D deficiency, unspecified: Secondary | ICD-10-CM

## 2023-11-18 DIAGNOSIS — R739 Hyperglycemia, unspecified: Secondary | ICD-10-CM

## 2023-11-20 ENCOUNTER — Other Ambulatory Visit (INDEPENDENT_AMBULATORY_CARE_PROVIDER_SITE_OTHER)

## 2023-11-20 DIAGNOSIS — R739 Hyperglycemia, unspecified: Secondary | ICD-10-CM | POA: Diagnosis not present

## 2023-11-20 DIAGNOSIS — E559 Vitamin D deficiency, unspecified: Secondary | ICD-10-CM

## 2023-11-20 LAB — BASIC METABOLIC PANEL WITH GFR
BUN: 15 mg/dL (ref 6–23)
CO2: 27 meq/L (ref 19–32)
Calcium: 8.7 mg/dL (ref 8.4–10.5)
Chloride: 102 meq/L (ref 96–112)
Creatinine, Ser: 0.8 mg/dL (ref 0.40–1.20)
GFR: 90.27 mL/min (ref >60.00)
Glucose, Bld: 112 mg/dL — ABNORMAL HIGH (ref 70–99)
Potassium: 4 meq/L (ref 3.5–5.1)
Sodium: 137 meq/L (ref 135–145)

## 2023-11-20 LAB — LIPID PANEL
Cholesterol: 168 mg/dL (ref 0–200)
HDL: 55.4 mg/dL (ref >39.00)
LDL Cholesterol: 94 mg/dL (ref 0–99)
NonHDL: 112.77
Total CHOL/HDL Ratio: 3
Triglycerides: 96 mg/dL (ref 0.0–149.0)
VLDL: 19.2 mg/dL (ref 0.0–40.0)

## 2023-11-20 LAB — VITAMIN D 25 HYDROXY (VIT D DEFICIENCY, FRACTURES): VITD: 20.76 ng/mL — ABNORMAL LOW (ref 30.00–100.00)

## 2023-11-23 ENCOUNTER — Ambulatory Visit: Payer: Self-pay | Admitting: Family Medicine

## 2023-11-27 ENCOUNTER — Encounter: Payer: Self-pay | Admitting: Family Medicine

## 2023-11-27 ENCOUNTER — Ambulatory Visit (INDEPENDENT_AMBULATORY_CARE_PROVIDER_SITE_OTHER): Admitting: Family Medicine

## 2023-11-27 VITALS — BP 138/102 | HR 97 | Temp 98.1°F | Ht 66.0 in | Wt 255.0 lb

## 2023-11-27 DIAGNOSIS — L659 Nonscarring hair loss, unspecified: Secondary | ICD-10-CM

## 2023-11-27 DIAGNOSIS — E559 Vitamin D deficiency, unspecified: Secondary | ICD-10-CM

## 2023-11-27 DIAGNOSIS — F411 Generalized anxiety disorder: Secondary | ICD-10-CM | POA: Diagnosis not present

## 2023-11-27 DIAGNOSIS — R739 Hyperglycemia, unspecified: Secondary | ICD-10-CM

## 2023-11-27 DIAGNOSIS — Z0001 Encounter for general adult medical examination with abnormal findings: Secondary | ICD-10-CM | POA: Diagnosis not present

## 2023-11-27 DIAGNOSIS — R030 Elevated blood-pressure reading, without diagnosis of hypertension: Secondary | ICD-10-CM | POA: Insufficient documentation

## 2023-11-27 DIAGNOSIS — Z23 Encounter for immunization: Secondary | ICD-10-CM

## 2023-11-27 DIAGNOSIS — R12 Heartburn: Secondary | ICD-10-CM | POA: Insufficient documentation

## 2023-11-27 MED ORDER — VITAMIN D (ERGOCALCIFEROL) 1.25 MG (50000 UNIT) PO CAPS: 50000.0000 [IU] | ORAL_CAPSULE | ORAL | 1 refills | Status: DC

## 2023-11-27 MED ORDER — OMEPRAZOLE 20 MG PO CPDR: 20.0000 mg | DELAYED_RELEASE_CAPSULE | Freq: Every day | ORAL | 3 refills | Status: DC

## 2023-11-27 MED ORDER — PANTOPRAZOLE SODIUM 20 MG PO TBEC: 20.0000 mg | DELAYED_RELEASE_TABLET | Freq: Every day | ORAL | 3 refills | Status: DC

## 2023-11-27 MED ORDER — CITALOPRAM HYDROBROMIDE 40 MG PO TABS: 40.0000 mg | ORAL_TABLET | Freq: Every day | ORAL | 3 refills | Status: DC

## 2023-11-27 NOTE — Progress Notes (Signed)
 Ph: [phone] Fax: [phone]   Patient ID: Jacqueline Vazquez, female    DOB: December 25, 1980, 43 y.o.   MRN: [id number]  This visit was conducted in person.  BP (!) 138/102   Pulse 97   Temp 98.1 F (36.7 C) (Oral)   Ht 5' 6 (1.676 m)   Wt 255 lb (115.7 kg)   SpO2 97%   BMI 41.16 kg/m   140s/100 on repeat testing   CC: CPE Subjective:   HPI: Jacqueline Vazquez is a 43 y.o. female presenting on 11/27/2023 for Annual Exam   Notes increasing muscle cramping  as well as some hair loss over the past few months.  Vit D levels were low - despite regularly taking vit D 1000 units daily.   Told perimenopausal by elevated FSH levels at GYN.  Notes fmhx early menopause.   Mother with thyroid  disease.  She notes drinking 4 cups of caffeinated beverage daily.   Ongoing anxiety managed with celexa .   Preventative: Well woman exam - with OBGYN Dr German at [facility]. Upcoming appt 01/2024.  Mammo 12/2022 - normal - gets this through GYN LMP early 11/2023, becoming more irregular  Flu shot yearly  COVID vaccine 05/2019, 06/2019, booster 01/2020 Tdap 2013 , update today Seat belt use discussed  Sunscreen use discussed. Sees dermatology 6 months ago. No h/o melanoma.  Ex smoker - quit 2014, previously 12 PY hx. Husband smokes outdoors.  Alcohol - 1-2 glasses of wine socially  Dentist q6 mo  Eye exam - due, upcoming appt at end of month   Lives with husband and daughter, 1 dog. Mother Jacqueline Vazquez) recently moved in with them. Occupation: Office manager at [employer]   Edu: college  Activity: walking with daughter some  Diet: drinking more water, fruits/vegetables regularly, cooking at home     Relevant past medical, surgical, family and social history reviewed and updated as indicated. Interim medical history since our last visit reviewed. Allergies and medications reviewed and updated. Outpatient Medications Prior to Visit  Medication Sig  Dispense Refill   fexofenadine (ALLEGRA ALLERGY) 60 MG tablet Take 60 mg by mouth daily.     LORazepam  (ATIVAN ) 0.5 MG tablet TAKE 1/2-1 TABLETS BY MOUTH 2 (TWO) TIMES DAILY AS NEEDED FOR ANXIETY. 30 tablet 0   Cholecalciferol (VITAMIN D3) 25 MCG (1000 UT) CAPS Take 1 capsule (1,000 Units total) by mouth daily. 30 capsule    citalopram  (CELEXA ) 40 MG tablet TAKE 1 TABLET BY MOUTH EVERY DAY 90 tablet 4   esomeprazole (NEXIUM) 20 MG capsule Take 20 mg by mouth daily at 12 noon. OTC (Patient not taking: Reported on 11/27/2023)     No facility-administered medications prior to visit.    Family History  Problem Relation Age of Onset   Depression Mother        with anxiety   Hypertension Father    Diabetes Father    Hyperlipidemia Father    Heart disease Father        CHF   Cancer Sister 6       cervical - mid 29s   Diabetes Maternal Grandmother    Cancer Maternal Grandfather        esophageal and lung (smoker)   Stroke Neg Hx    Per HPI unless specifically indicated in ROS section below Review of Systems  Constitutional:  Negative for activity change, appetite change, chills, fatigue, fever and unexpected weight change.  HENT:  Negative for hearing  loss.   Eyes:  Negative for visual disturbance.  Respiratory:  Negative for cough, chest tightness, shortness of breath and wheezing.   Cardiovascular:  Positive for leg swelling (occ dependent at end of day). Negative for chest pain and palpitations.  Gastrointestinal:  Negative for abdominal distention, abdominal pain, blood in stool, constipation, diarrhea, nausea and vomiting.  Endocrine: Negative for cold intolerance and heat intolerance.       Some hot flashes, some hair loss noted   Genitourinary:  Negative for difficulty urinating and hematuria.  Musculoskeletal:  Negative for arthralgias, myalgias and neck pain.  Skin:  Negative for rash.  Neurological:  Positive for headaches (occ). Negative for dizziness, seizures and syncope.   Hematological:  Negative for adenopathy. Does not bruise/bleed easily.  Psychiatric/Behavioral:  Negative for dysphoric mood. The patient is nervous/anxious.     Objective:  BP (!) 138/102   Pulse 97   Temp 98.1 F (36.7 C) (Oral)   Ht 5' 6 (1.676 m)   Wt 255 lb (115.7 kg)   SpO2 97%   BMI 41.16 kg/m   Wt Readings from Last 3 Encounters:  11/27/23 255 lb (115.7 kg)  11/08/21 247 lb 4 oz (112.2 kg)  08/26/20 250 lb 8 oz (113.6 kg)      Physical Exam Vitals and nursing note reviewed.  Constitutional:      Appearance: Normal appearance. She is not ill-appearing.  HENT:     Head: Normocephalic and atraumatic.     Right Ear: Tympanic membrane, ear canal and external ear normal. There is no impacted cerumen.     Left Ear: Tympanic membrane, ear canal and external ear normal. There is no impacted cerumen.     Mouth/Throat:     Mouth: Mucous membranes are moist.     Pharynx: Oropharynx is clear. No oropharyngeal exudate or posterior oropharyngeal erythema.  Eyes:     General:        Right eye: No discharge.        Left eye: No discharge.     Extraocular Movements: Extraocular movements intact.     Conjunctiva/sclera: Conjunctivae normal.     Pupils: Pupils are equal, round, and reactive to light.  Neck:     Thyroid : No thyroid  mass or thyromegaly.  Cardiovascular:     Rate and Rhythm: Regular rhythm. Tachycardia present.     Pulses: Normal pulses.     Heart sounds: Normal heart sounds. No murmur heard. Pulmonary:     Effort: Pulmonary effort is normal. No respiratory distress.     Breath sounds: Normal breath sounds. No wheezing, rhonchi or rales.  Abdominal:     General: Bowel sounds are normal. There is no distension.     Palpations: Abdomen is soft. There is no mass.     Tenderness: There is no abdominal tenderness. There is no guarding or rebound.     Hernia: No hernia is present.  Musculoskeletal:     Cervical back: Normal range of motion and neck supple. No  rigidity.     Right lower leg: No edema.     Left lower leg: No edema.  Lymphadenopathy:     Cervical: No cervical adenopathy.  Skin:    General: Skin is warm and dry.     Findings: No rash.  Neurological:     General: No focal deficit present.     Mental Status: She is alert. Mental status is at baseline.  Psychiatric:        Mood and  Affect: Mood normal.        Behavior: Behavior normal.       Results for orders placed or performed in visit on 11/20/23  VITAMIN D 25 Hydroxy (Vit-D Deficiency, Fractures)   Collection Time: 11/20/23  8:26 AM  Result Value Ref Range   VITD 20.76 (L) 30.00 - 100.00 ng/mL  Basic metabolic panel with GFR   Collection Time: 11/20/23  8:26 AM  Result Value Ref Range   Sodium 137 135 - 145 mEq/L   Potassium 4.0 3.5 - 5.1 mEq/L   Chloride 102 96 - 112 mEq/L   CO2 27 19 - 32 mEq/L   Glucose, Bld 112 (H) 70 - 99 mg/dL   BUN 15 6 - 23 mg/dL   Creatinine, Ser 9.19 0.40 - 1.20 mg/dL   GFR 09.72 >39.99 mL/min   Calcium 8.7 8.4 - 10.5 mg/dL  Lipid panel   Collection Time: 11/20/23  8:26 AM  Result Value Ref Range   Cholesterol 168 0 - 200 mg/dL   Triglycerides 03.9 0.0 - 149.0 mg/dL   HDL 44.59 >60.99 mg/dL   VLDL 80.7 0.0 - 59.9 mg/dL   LDL Cholesterol 94 0 - 99 mg/dL   Total CHOL/HDL Ratio 3    NonHDL 112.77    Lab Results  Component Value Date   TSH 2.35 05/06/2019   No results found for: IRON, TIBC, FERRITIN  Assessment & Plan:   Problem List Items Addressed This Visit     Encounter for general adult medical examination with abnormal findings - Primary (Chronic)   Preventative protocols reviewed and updated unless pt declined. Discussed healthy diet and lifestyle.       GAD (generalized anxiety disorder)   Chronic, overall stable period on celexa  40mg  - Jacqueline refill.  Reviewed interaction between PPI omeprazole  and celexa  - Jacqueline switch PPI to pantoprazole.  Consider updated EKG to monitor QT interval.      Relevant  Medications   citalopram  (CELEXA ) 40 MG tablet   Obesity, morbid, BMI 40.0-49.9 (HCC)   Reviewed healthy diet and lifestyle changes to effect sustainable weight loss.        Hyperglycemia   Reviewed diet and lifestyle choices to maintain glycemic control.       Vitamin D deficiency   Levels low despite vit D 1000 units daily. Start weekly prescription strength replacement x 6 months then return to vit D3 2000 units daily.       Elevated blood pressure reading in office without diagnosis of hypertension   Reviewed diet and lifestyle choices to control blood pressure. DASH diet provided  Update TSH.  BP log sheet provided to monitor at home. RTC 3 mo HTN f/u visit.  Encouraged backing off caffeine.       Heartburn   Continues daily PPI - switch from omeprazole  to pantoprazole due to celexa  interaction.      Other Visit Diagnoses       Hair loss       Relevant Orders   TSH   CBC with Differential/Platelet   Ferritin   IBC panel     Encounter for immunization       Relevant Orders   Flu vaccine trivalent PF, 6mos and older(Flulaval,Afluria,Fluarix,Fluzone) (Completed)        Meds ordered this encounter  Medications   Vitamin D, Ergocalciferol, (DRISDOL) 1.25 MG (50000 UNIT) CAPS capsule    Sig: Take 1 capsule (50,000 Units total) by mouth every 7 (seven) days.    Dispense:  12 capsule    Refill:  1   citalopram  (CELEXA ) 40 MG tablet    Sig: Take 1 tablet (40 mg total) by mouth daily.    Dispense:  90 tablet    Refill:  3   DISCONTD: omeprazole  (PRILOSEC) 20 MG capsule    Sig: Take 1 capsule (20 mg total) by mouth daily.    Dispense:  90 capsule    Refill:  3   pantoprazole (PROTONIX) 20 MG tablet    Sig: Take 1 tablet (20 mg total) by mouth daily.    Dispense:  90 tablet    Refill:  3    To replace omeprazole  due to drug interaction with celexa     Orders Placed This Encounter  Procedures   Flu vaccine trivalent PF, 6mos and  older(Flulaval,Afluria,Fluarix,Fluzone)   Tdap vaccine greater than or equal to 7yo IM   TSH   CBC with Differential/Platelet   Ferritin   IBC panel    Patient Instructions  Flu shot today. Tdap today.  Stop omeprazole , in its place try pantoprazole (protonix) 20mg  daily for heartburn/reflux.   Celexa  refilled.   Your goal blood pressure is <140/90. Work on low salt/sodium diet - goal <2 grams (2,000mg ) per day. Eat a diet high in fruits/vegetables and whole grains.  Look into mediterranean and DASH diet.  Goal activity is 170min/wk of moderate intensity exercise.  This can be split into 30 minute chunks.  If you are not at this level, you can start with smaller 10-15 min increments and slowly build up activity. Keep an eye on blood pressures at home, let me know if consistently staying above goal.  Look at www.heart.org for more resources   Return in 3 months for hypertension follow up.   Follow up plan: Return in about 3 months (around 02/27/2024) for follow up visit.  Anton Blas, MD

## 2023-11-27 NOTE — Assessment & Plan Note (Signed)
 Reviewed healthy diet and lifestyle changes to effect sustainable weight loss.

## 2023-11-27 NOTE — Assessment & Plan Note (Signed)
 Preventative protocols reviewed and updated unless pt declined. Discussed healthy diet and lifestyle.

## 2023-11-27 NOTE — Assessment & Plan Note (Signed)
 Continues daily PPI - switch from omeprazole  to pantoprazole due to celexa  interaction.

## 2023-11-27 NOTE — Assessment & Plan Note (Signed)
 Reviewed diet and lifestyle choices to maintain glycemic control.

## 2023-11-27 NOTE — Assessment & Plan Note (Addendum)
 Chronic, overall stable period on celexa  40mg  - will refill.  Reviewed interaction between PPI omeprazole  and celexa  - will switch PPI to pantoprazole.  Consider updated EKG to monitor QT interval.

## 2023-11-27 NOTE — Assessment & Plan Note (Signed)
 Levels low despite vit D 1000 units daily. Start weekly prescription strength replacement x 6 months then return to vit D3 2000 units daily.

## 2023-11-27 NOTE — Patient Instructions (Addendum)
 Flu shot today. Tdap today.  Stop omeprazole , in its place try pantoprazole (protonix) 20mg  daily for heartburn/reflux.   Celexa  refilled.   Your goal blood pressure is <140/90. Work on low salt/sodium diet - goal <2 grams (2,000mg ) per day. Eat a diet high in fruits/vegetables and whole grains.  Look into mediterranean and DASH diet.  Goal activity is 163min/wk of moderate intensity exercise.  This can be split into 30 minute chunks.  If you are not at this level, you can start with smaller 10-15 min increments and slowly build up activity. Keep an eye on blood pressures at home, let me know if consistently staying above goal.  Look at www.heart.org for more resources   Return in 3 months for hypertension follow up.

## 2023-11-27 NOTE — Assessment & Plan Note (Signed)
 Reviewed diet and lifestyle choices to control blood pressure. DASH diet provided  Update TSH.  BP log sheet provided to monitor at home. RTC 3 mo HTN f/u visit.  Encouraged backing off caffeine.

## 2023-11-28 LAB — CBC WITH DIFFERENTIAL/PLATELET
Basophils Absolute: 0.1 K/uL (ref 0.0–0.1)
Basophils Relative: 0.9 % (ref 0.0–3.0)
Eosinophils Absolute: 0.1 K/uL (ref 0.0–0.7)
Eosinophils Relative: 1 % (ref 0.0–5.0)
HCT: 40.4 % (ref 36.0–46.0)
Hemoglobin: 13.4 g/dL (ref 12.0–15.0)
Lymphocytes Relative: 20.2 % (ref 12.0–46.0)
Lymphs Abs: 1.6 K/uL (ref 0.7–4.0)
MCHC: 33.2 g/dL (ref 30.0–36.0)
MCV: 88.9 fl (ref 78.0–100.0)
Monocytes Absolute: 0.8 K/uL (ref 0.1–1.0)
Monocytes Relative: 9.4 % (ref 3.0–12.0)
Neutro Abs: 5.6 K/uL (ref 1.4–7.7)
Neutrophils Relative %: 68.5 % (ref 43.0–77.0)
Platelets: 274 K/uL (ref 150.0–400.0)
RBC: 4.54 Mil/uL (ref 3.87–5.11)
RDW: 14.6 % (ref 11.5–15.5)
WBC: 8.2 K/uL (ref 4.0–10.5)

## 2023-11-28 LAB — IBC PANEL
Iron: 39 ug/dL — ABNORMAL LOW (ref 42–145)
Saturation Ratios: 8.9 % — ABNORMAL LOW (ref 20.0–50.0)
TIBC: 439.6 ug/dL (ref 250.0–450.0)
Transferrin: 314 mg/dL (ref 212.0–360.0)

## 2023-11-28 LAB — TSH: TSH: 1.56 u[IU]/mL (ref 0.35–5.50)

## 2023-11-28 LAB — FERRITIN: Ferritin: 11.7 ng/mL (ref 10.0–291.0)

## 2023-11-29 ENCOUNTER — Ambulatory Visit: Payer: Self-pay | Admitting: Family Medicine

## 2023-11-29 DIAGNOSIS — E611 Iron deficiency: Secondary | ICD-10-CM | POA: Insufficient documentation

## 2023-11-29 MED ORDER — IRON (FERROUS SULFATE) 325 (65 FE) MG PO TABS: 325.0000 mg | ORAL_TABLET | ORAL | Status: AC

## 2023-12-26 ENCOUNTER — Other Ambulatory Visit: Payer: Self-pay

## 2023-12-26 DIAGNOSIS — F411 Generalized anxiety disorder: Secondary | ICD-10-CM

## 2023-12-26 MED ORDER — PANTOPRAZOLE SODIUM 20 MG PO TBEC: 20.0000 mg | DELAYED_RELEASE_TABLET | Freq: Every day | ORAL | 3 refills | Status: AC

## 2023-12-26 MED ORDER — CITALOPRAM HYDROBROMIDE 40 MG PO TABS: 40.0000 mg | ORAL_TABLET | Freq: Every day | ORAL | 3 refills | Status: AC

## 2023-12-26 NOTE — Telephone Encounter (Signed)
 Name of Medication: Lorazepam   Name of Pharmacy: [facility]  Last [place] or Written Date and Quantity: 06/01/23 #30 no rf  Last Office Visit and Type: CPE 11/27/23 Next Office Visit and Type: 02/25/23 Last Controlled Substance Agreement Date: n/a  Last UDS: n/a

## 2023-12-27 MED ORDER — LORAZEPAM 0.5 MG PO TABS: 0.5000 mg | ORAL_TABLET | Freq: Two times a day (BID) | ORAL | 0 refills | Status: DC | PRN

## 2023-12-27 NOTE — Telephone Encounter (Signed)
 ERx

## 2024-02-24 ENCOUNTER — Other Ambulatory Visit: Payer: Self-pay | Admitting: Family Medicine

## 2024-02-24 DIAGNOSIS — E559 Vitamin D deficiency, unspecified: Secondary | ICD-10-CM

## 2024-02-25 ENCOUNTER — Encounter: Payer: Self-pay | Admitting: Family Medicine

## 2024-02-25 ENCOUNTER — Ambulatory Visit: Admitting: Family Medicine

## 2024-02-25 VITALS — BP 130/80 | HR 82 | Temp 97.5°F | Ht 66.0 in | Wt 245.6 lb

## 2024-02-25 DIAGNOSIS — K625 Hemorrhage of anus and rectum: Secondary | ICD-10-CM | POA: Insufficient documentation

## 2024-02-25 DIAGNOSIS — E611 Iron deficiency: Secondary | ICD-10-CM

## 2024-02-25 DIAGNOSIS — E559 Vitamin D deficiency, unspecified: Secondary | ICD-10-CM | POA: Diagnosis not present

## 2024-02-25 DIAGNOSIS — R030 Elevated blood-pressure reading, without diagnosis of hypertension: Secondary | ICD-10-CM

## 2024-02-25 DIAGNOSIS — R739 Hyperglycemia, unspecified: Secondary | ICD-10-CM

## 2024-02-25 NOTE — Assessment & Plan Note (Signed)
 Update iron  levels 05/2024.

## 2024-02-25 NOTE — Assessment & Plan Note (Signed)
 Continue weekly 50k replacement then reassess control 05/2024 - labs ordered.

## 2024-02-25 NOTE — Assessment & Plan Note (Signed)
 Improved BP control with healthy diet and lifestyle changes implemented to date - congratulated. She is motivated to continue sustainable changes.

## 2024-02-25 NOTE — Progress Notes (Signed)
 " Ph: [phone] Fax: [phone]   Patient ID: Jacqueline Vazquez, female    DOB: 11-24-1980, 44 y.o.   MRN: [id number]  This visit was conducted in person.  BP 130/80 (BP Location: Left Arm, Patient Position: Sitting, Cuff Size: Large)   Pulse 82   Temp (!) 97.5 F (36.4 C) (Oral)   Ht 5' 6 (1.676 m)   Wt 245 lb 9.6 oz (111.4 kg)   SpO2 98%   BMI 39.64 kg/m   BP Readings from Last 3 Encounters:  02/25/24 130/80  11/27/23 (!) 138/102  01/03/22 129/88    CC: 3 mo HTN f/u visit  Subjective:   HPI: Jacqueline Vazquez is a 44 y.o. female presenting on 02/25/2024 for Medical Management of Chronic Issues (BP F/U/BP log at home, pt states it varies a lot/Pt states she has cut off a lot of sodium, and is trying to eat better overall )   HTN - Compliant with current antihypertensive regimen of diet control.  Does check blood pressures at home: up and down. BP cuff broke - has ordered new one. No low blood pressure readings or symptoms of dizziness/syncope.  Denies HA, vision changes, CP/tightness, SOB, leg swelling.   10 lb weight loss!  She has changed diet - cut down on fried and fast foods, eating more grilled chicken.   Previously noted muscle cramping and hair loss.  Low iron  - ferrous sulfate  every other day started 11/2023.  Vit D def - has been managing with weekly 50k units x76mo, started 11/2023. Notes improvement in energy  Cycles - perimenopausal, more irregular about 1 period every 3 months.      Relevant past medical, surgical, family and social history reviewed and updated as indicated. Interim medical history since our last visit reviewed. Allergies and medications reviewed and updated. Outpatient Medications Prior to Visit  Medication Sig Dispense Refill   citalopram  (CELEXA ) 40 MG tablet Take 1 tablet (40 mg total) by mouth daily. 90 tablet 3   fexofenadine (ALLEGRA ALLERGY) 60 MG tablet Take 60 mg by mouth daily.     Iron , Ferrous Sulfate , 325 (65 Fe) MG  TABS Take 325 mg by mouth every Monday, Wednesday, and Friday.     LORazepam  (ATIVAN ) 0.5 MG tablet Take 1 tablet (0.5 mg total) by mouth 2 (two) times daily as needed for anxiety. 30 tablet 0   pantoprazole  (PROTONIX ) 20 MG tablet Take 1 tablet (20 mg total) by mouth daily. 90 tablet 3   Vitamin D , Ergocalciferol , (DRISDOL ) 1.25 MG (50000 UNIT) CAPS capsule Take 1 capsule (50,000 Units total) by mouth every 7 (seven) days. 12 capsule 1   No facility-administered medications prior to visit.     Per HPI unless specifically indicated in ROS section below Review of Systems  Objective:  BP 130/80 (BP Location: Left Arm, Patient Position: Sitting, Cuff Size: Large)   Pulse 82   Temp (!) 97.5 F (36.4 C) (Oral)   Ht 5' 6 (1.676 m)   Wt 245 lb 9.6 oz (111.4 kg)   SpO2 98%   BMI 39.64 kg/m   Wt Readings from Last 3 Encounters:  02/25/24 245 lb 9.6 oz (111.4 kg)  11/27/23 255 lb (115.7 kg)  11/08/21 247 lb 4 oz (112.2 kg)      Physical Exam Vitals and nursing note reviewed.  Constitutional:      Appearance: Normal appearance. She is not ill-appearing.  HENT:     Head: Normocephalic and atraumatic.  Mouth/Throat:     Mouth: Mucous membranes are moist.     Pharynx: Oropharynx is clear. No oropharyngeal exudate or posterior oropharyngeal erythema.  Eyes:     Extraocular Movements: Extraocular movements intact.     Conjunctiva/sclera: Conjunctivae normal.     Pupils: Pupils are equal, round, and reactive to light.  Cardiovascular:     Rate and Rhythm: Normal rate and regular rhythm.     Pulses: Normal pulses.     Heart sounds: Normal heart sounds. No murmur heard. Pulmonary:     Effort: Pulmonary effort is normal. No respiratory distress.     Breath sounds: Normal breath sounds. No wheezing or rhonchi.  Musculoskeletal:     Cervical back: Normal range of motion and neck supple. No rigidity.     Right lower leg: No edema.     Left lower leg: No edema.  Lymphadenopathy:      Cervical: No cervical adenopathy.  Skin:    General: Skin is warm and dry.     Findings: No rash.  Neurological:     Mental Status: She is alert.  Psychiatric:        Mood and Affect: Mood normal.        Behavior: Behavior normal.       Results for orders placed or performed in visit on 11/27/23  TSH   Collection Time: 11/27/23  3:04 PM  Result Value Ref Range   TSH 1.56 0.35 - 5.50 uIU/mL  CBC with Differential/Platelet   Collection Time: 11/27/23  3:04 PM  Result Value Ref Range   WBC 8.2 4.0 - 10.5 K/uL   RBC 4.54 3.87 - 5.11 Mil/uL   Hemoglobin 13.4 12.0 - 15.0 g/dL   HCT 59.5 63.9 - 53.9 %   MCV 88.9 78.0 - 100.0 fl   MCHC 33.2 30.0 - 36.0 g/dL   RDW 85.3 88.4 - 84.4 %   Platelets 274.0 150.0 - 400.0 K/uL   Neutrophils Relative % 68.5 43.0 - 77.0 %   Lymphocytes Relative 20.2 12.0 - 46.0 %   Monocytes Relative 9.4 3.0 - 12.0 %   Eosinophils Relative 1.0 0.0 - 5.0 %   Basophils Relative 0.9 0.0 - 3.0 %   Neutro Abs 5.6 1.4 - 7.7 K/uL   Lymphs Abs 1.6 0.7 - 4.0 K/uL   Monocytes Absolute 0.8 0.1 - 1.0 K/uL   Eosinophils Absolute 0.1 0.0 - 0.7 K/uL   Basophils Absolute 0.1 0.0 - 0.1 K/uL  Ferritin   Collection Time: 11/27/23  3:04 PM  Result Value Ref Range   Ferritin 11.7 10.0 - 291.0 ng/mL  IBC panel   Collection Time: 11/27/23  3:04 PM  Result Value Ref Range   Iron  39 (L) 42 - 145 ug/dL   Transferrin 685.9 787.9 - 360.0 mg/dL   Saturation Ratios 8.9 (L) 20.0 - 50.0 %   TIBC 439.6 250.0 - 450.0 mcg/dL   Lab Results  Component Value Date   NA 137 11/20/2023   CL 102 11/20/2023   K 4.0 11/20/2023   CO2 27 11/20/2023   BUN 15 11/20/2023   CREATININE 0.80 11/20/2023   GFR 90.27 11/20/2023   CALCIUM 8.7 11/20/2023   ALBUMIN 3.9 06/15/2020   GLUCOSE 112 (H) 11/20/2023    Assessment & Plan:   Problem List Items Addressed This Visit     Severe obesity (BMI 35.0-39.9) with comorbidity (HCC)   Congratulated on healthy diet changes to date - with resultant  10lb weight loss.  Hyperglycemia   Update BMP in 3 months      Relevant Orders   Basic metabolic panel with GFR   Vitamin D  deficiency   Continue weekly 50k replacement then reassess control 05/2024 - labs ordered.       Relevant Orders   VITAMIN D  25 Hydroxy (Vit-D Deficiency, Fractures)   Elevated blood pressure reading in office without diagnosis of hypertension - Primary   Improved BP control with healthy diet and lifestyle changes implemented to date - congratulated. She is motivated to continue sustainable changes.       Iron  deficiency   Update iron  levels 05/2024.       Relevant Orders   IBC panel   Ferritin   BRBPR (bright red blood per rectum)   Attributed to constipation exacerbating presumed int hem in setting of oral iron  intake.  Will suggest starting stool softener colace vs miralax.  Encouraged good water and iron  intake.         No orders of the defined types were placed in this encounter.   Orders Placed This Encounter  Procedures   Basic metabolic panel with GFR    Standing Status:   Future    Expiration Date:   02/24/2025   IBC panel    Standing Status:   Future    Expiration Date:   02/24/2025   Ferritin    Standing Status:   Future    Expiration Date:   02/24/2025   VITAMIN D  25 Hydroxy (Vit-D Deficiency, Fractures)    Standing Status:   Future    Expiration Date:   02/24/2025    Patient Instructions  Blood pressures are doing great! Continue healthy diet and lifestyle changes  Schedule fasting lab visit only in 3 months  Schedule physical for after Nov 26, 2024.  Continue monitoring blood pressures occasionally at home, let me know if consistently >140/90.   Follow up plan: Return in about 9 months (around 11/26/2024) for annual exam, prior fasting for blood work.  Anton Blas, MD   "

## 2024-02-25 NOTE — Assessment & Plan Note (Signed)
 Congratulated on healthy diet changes to date - with resultant 10lb weight loss.

## 2024-02-25 NOTE — Patient Instructions (Addendum)
 Blood pressures are doing great! Continue healthy diet and lifestyle changes  Schedule fasting lab visit only in 3 months  Schedule physical for after Nov 26, 2024.  Continue monitoring blood pressures occasionally at home, let me know if consistently >140/90.

## 2024-02-25 NOTE — Assessment & Plan Note (Signed)
 Update BMP in 3 months

## 2024-02-25 NOTE — Assessment & Plan Note (Signed)
 Attributed to constipation exacerbating presumed int hem in setting of oral iron  intake.  Will suggest starting stool softener colace vs miralax.  Encouraged good water and iron  intake.

## 2024-02-26 ENCOUNTER — Other Ambulatory Visit: Payer: Self-pay | Admitting: Family Medicine

## 2024-02-26 DIAGNOSIS — F411 Generalized anxiety disorder: Secondary | ICD-10-CM

## 2024-02-27 NOTE — Telephone Encounter (Signed)
 Requesting: Lorazepam    Contract: No UDS:  Last Visit: 02/25/2024 Next Visit: Visit date not found Last Refill: 12/27/23   Please Advise

## 2024-02-27 NOTE — Telephone Encounter (Signed)
 ERx
